# Patient Record
Sex: Female | Born: 1960 | Race: White | Hispanic: No | Marital: Married | Smoking: Never smoker
Health system: Southern US, Community
[De-identification: ages and names within clinical notes are randomized; demographics above are authoritative.]

## PROBLEM LIST (undated history)

## (undated) DIAGNOSIS — F419 Anxiety disorder, unspecified: Secondary | ICD-10-CM

## (undated) DIAGNOSIS — E7800 Pure hypercholesterolemia, unspecified: Secondary | ICD-10-CM

## (undated) DIAGNOSIS — F329 Major depressive disorder, single episode, unspecified: Secondary | ICD-10-CM

## (undated) DIAGNOSIS — K219 Gastro-esophageal reflux disease without esophagitis: Secondary | ICD-10-CM

## (undated) DIAGNOSIS — F32A Depression, unspecified: Secondary | ICD-10-CM

## (undated) DIAGNOSIS — R52 Pain, unspecified: Secondary | ICD-10-CM

## (undated) HISTORY — PX: BLADDER SURGERY: SHX569

---

## 1999-12-08 ENCOUNTER — Encounter: Admission: RE | Admit: 1999-12-08 | Discharge: 1999-12-08 | Payer: Self-pay | Admitting: Obstetrics and Gynecology

## 1999-12-08 ENCOUNTER — Encounter: Payer: Self-pay | Admitting: Obstetrics and Gynecology

## 2001-07-19 ENCOUNTER — Other Ambulatory Visit: Admission: RE | Admit: 2001-07-19 | Discharge: 2001-07-19 | Payer: Self-pay | Admitting: Obstetrics and Gynecology

## 2002-06-13 ENCOUNTER — Encounter: Payer: Self-pay | Admitting: Obstetrics and Gynecology

## 2002-06-13 ENCOUNTER — Encounter: Admission: RE | Admit: 2002-06-13 | Discharge: 2002-06-13 | Payer: Self-pay | Admitting: Obstetrics and Gynecology

## 2002-06-30 ENCOUNTER — Encounter: Payer: Self-pay | Admitting: Obstetrics and Gynecology

## 2002-06-30 ENCOUNTER — Ambulatory Visit (HOSPITAL_COMMUNITY): Admission: RE | Admit: 2002-06-30 | Discharge: 2002-06-30 | Payer: Self-pay | Admitting: Obstetrics and Gynecology

## 2005-12-26 ENCOUNTER — Encounter: Admission: RE | Admit: 2005-12-26 | Discharge: 2005-12-26 | Payer: Self-pay | Admitting: Obstetrics and Gynecology

## 2006-05-08 HISTORY — PX: FEMORAL HERNIA REPAIR: SUR1179

## 2006-09-26 ENCOUNTER — Emergency Department (HOSPITAL_COMMUNITY): Admission: EM | Admit: 2006-09-26 | Discharge: 2006-09-26 | Payer: Self-pay | Admitting: Emergency Medicine

## 2007-04-10 ENCOUNTER — Ambulatory Visit (HOSPITAL_COMMUNITY): Admission: RE | Admit: 2007-04-10 | Discharge: 2007-04-10 | Payer: Self-pay | Admitting: Surgery

## 2010-05-08 HISTORY — PX: BUNIONETTE EXCISION: SUR500

## 2010-09-20 NOTE — Op Note (Signed)
Savannah Whitney, Savannah Whitney                ACCOUNT NO.:  [id number]   MEDICAL RECORD NO.:  [id number]          PATIENT TYPE:  AMB   LOCATION:  DAY                          FACILITY:  [facility]   PHYSICIAN:  Sandria Bales. Ezzard Standing, M.D.  DATE OF BIRTH:  Apr 29, 1961   DATE OF PROCEDURE:  DATE OF DISCHARGE:  04/10/2007                               OPERATIVE REPORT   PREOPERATIVE DIAGNOSIS:  Right femoral hernia.   PREOPERATIVE DIAGNOSIS:  Small right femoral hernia and small indirect  inguinal hernia.   PROCEDURE:  Open right right femoral hernia repair.   SURGEON:  Sandria Bales. Ezzard Standing, M.D.   FIRST ASSISTANT:  None.   ANESTHESIA:  General laryngeal mask airway, with 30 mL of 0.25%  Marcaine.   COMPLICATIONS:  None at the end of the procedure.   INDICATIONS FOR PROCEDURE:  The patient is a 50 year old white female, a  patient of Dr. Merri Brunette, who I originally saw in July 2008 for a  right groin mass.  I felt the right groin mass probably represented a  right femoral hernia and discussed with her about repairing this hernia.   The indications and potential complications of hernia repair were  explained to the patient.  These potential complications include but are  not limited to infection, bleeding, nerve injury, vascular injury, and  possibility of recurrence of the hernia.  Prior to the surgery, I marked  the patient, both the site of the hernia repair and I marked the  ballottable mass I could feel along the medial thigh.   OPERATIVE NOTE:  The patient was placed in the supine position her arms  and legs slightly apart her right groin shaved prepped with Betadine  solution and sterilely draped.   She is allergic to latex so we used latex-free supplies.  We had a time-  out identifying the patient and the procedure.  She was given 1 gram of  Ancef and initially at the procedure.   Her right groin incision was then externally dissection carried down to  the external oblique fascia, where  it was opened and then explored below  the inguinal ligament on the right side.  The area I had marked actually  seemed a little more medial than I would have expected for a femoral  hernia.  I identified the femoral vein.  I went medial to this.  I  opened up the sheath of the femoral vein and went medial to this and saw  no obvious mass or filling defect.  I then dissected along after I  opened up the external oblique fascia I circled the round ligaments I  elevated the round ligaments, I divided with #0 chromic suture on an SH  needle.  I ligated them at the take-off of the pubic tubercle and then I  ligated a second time at the internal ring.   She had no obvious direct hernia.  She had a small indirect hernia .  She did have a small defect  at the femoral canal though both these  defects were small.  I got my finger under where  I had marked the skin  and could find no correlating mass or lump in that area.   So, I think I identified the area of the weakness though what I found  was less than impressive than what I expected to find.   I then carried out inguinal floor repair using a piece of 3 x 6 atrium  mesh which was cut down to about 2.5 x 4 inches just medial to the pubic  tubercle and inferior to the Cooper's ligament inferior and laterally to  the transversalis fascia superiorly in line inferolaterally to the  ilioinguinal ligaments superiorly to the transversalis fascia.  The mesh  lay flat.  Again, coverage of what I thought was femoral hernia and  direct hernia well.   The wound was then irrigated.  I put about 30 mL of 0.25% Marcaine as a  local anesthetic in multiple layers in the fascial subcutaneous tissues.  I had sewn the mesh in with a #0 Novofil suture.  I closed the skin with  a subcutaneous tissue.  I closed the external oblique fascia with a 3-0  Vicryl suture and the skin with a 3-0 Vicryl, the subcutaneous tissue  with 3-0 Vicryl suture and the skin with a  5-0 Monocryl suture.  The  wound was covered with tincture of benzoin and Steri-Strips.   The patient tolerated the procedure well, and was transported to the  recovery room in good condition.      Sandria Bales. Ezzard Standing, M.D.  Electronically Signed     DHN/MEDQ  D:  04/10/2007  T:  04/10/2007  Job:  [id number]   cc:   Dario Guardian, M.D.  Fax: [phone]   Duke Salvia. Marcelle Overlie, M.D.  Fax: [phone]

## 2011-02-13 LAB — HEMOGLOBIN AND HEMATOCRIT, BLOOD
HCT: 38.3
Hemoglobin: 13

## 2011-02-13 LAB — PREGNANCY, URINE: Preg Test, Ur: NEGATIVE

## 2011-11-28 ENCOUNTER — Other Ambulatory Visit (HOSPITAL_COMMUNITY): Payer: Self-pay | Admitting: Obstetrics and Gynecology

## 2011-11-28 DIAGNOSIS — Z1231 Encounter for screening mammogram for malignant neoplasm of breast: Secondary | ICD-10-CM

## 2011-12-20 ENCOUNTER — Ambulatory Visit (HOSPITAL_COMMUNITY)
Admission: RE | Admit: 2011-12-20 | Discharge: 2011-12-20 | Disposition: A | Discharge status: Home / Self Care | Payer: Self-pay | Source: Ambulatory Visit | Attending: Obstetrics and Gynecology | Admitting: Obstetrics and Gynecology

## 2011-12-20 DIAGNOSIS — Z1231 Encounter for screening mammogram for malignant neoplasm of breast: Secondary | ICD-10-CM

## 2012-10-31 ENCOUNTER — Other Ambulatory Visit: Payer: Self-pay | Admitting: Nurse Practitioner

## 2012-10-31 ENCOUNTER — Other Ambulatory Visit (HOSPITAL_COMMUNITY)
Admission: RE | Admit: 2012-10-31 | Discharge: 2012-10-31 | Disposition: A | Discharge status: Home / Self Care | Payer: BC Managed Care – PPO | Source: Ambulatory Visit | Attending: Obstetrics and Gynecology | Admitting: Obstetrics and Gynecology

## 2012-10-31 DIAGNOSIS — Z01419 Encounter for gynecological examination (general) (routine) without abnormal findings: Secondary | ICD-10-CM | POA: Insufficient documentation

## 2012-10-31 DIAGNOSIS — Z1151 Encounter for screening for human papillomavirus (HPV): Secondary | ICD-10-CM | POA: Insufficient documentation

## 2012-11-06 ENCOUNTER — Ambulatory Visit: Payer: BC Managed Care – PPO | Attending: Nurse Practitioner | Admitting: Physical Therapy

## 2012-11-06 DIAGNOSIS — IMO0001 Reserved for inherently not codable concepts without codable children: Secondary | ICD-10-CM | POA: Insufficient documentation

## 2012-11-06 DIAGNOSIS — M629 Disorder of muscle, unspecified: Secondary | ICD-10-CM | POA: Insufficient documentation

## 2012-11-06 DIAGNOSIS — R32 Unspecified urinary incontinence: Secondary | ICD-10-CM | POA: Insufficient documentation

## 2012-11-06 DIAGNOSIS — M2420 Disorder of ligament, unspecified site: Secondary | ICD-10-CM | POA: Insufficient documentation

## 2012-11-13 ENCOUNTER — Ambulatory Visit: Payer: BC Managed Care – PPO | Admitting: Physical Therapy

## 2012-11-20 ENCOUNTER — Ambulatory Visit: Payer: BC Managed Care – PPO | Admitting: Physical Therapy

## 2012-11-27 ENCOUNTER — Ambulatory Visit: Payer: BC Managed Care – PPO | Admitting: Physical Therapy

## 2012-12-04 ENCOUNTER — Ambulatory Visit: Payer: BC Managed Care – PPO | Admitting: Physical Therapy

## 2013-01-01 ENCOUNTER — Ambulatory Visit: Payer: BC Managed Care – PPO | Admitting: Physical Therapy

## 2013-01-22 ENCOUNTER — Other Ambulatory Visit: Payer: Self-pay | Admitting: Gastroenterology

## 2013-03-21 ENCOUNTER — Other Ambulatory Visit: Payer: Self-pay

## 2013-03-21 DIAGNOSIS — Z1231 Encounter for screening mammogram for malignant neoplasm of breast: Secondary | ICD-10-CM

## 2013-04-09 ENCOUNTER — Other Ambulatory Visit (HOSPITAL_COMMUNITY): Payer: Self-pay | Admitting: Family Medicine

## 2013-04-09 ENCOUNTER — Ambulatory Visit (HOSPITAL_COMMUNITY)
Admission: RE | Admit: 2013-04-09 | Discharge: 2013-04-09 | Disposition: A | Discharge status: Home / Self Care | Payer: BC Managed Care – PPO | Source: Ambulatory Visit | Attending: Nurse Practitioner | Admitting: Nurse Practitioner

## 2013-04-09 DIAGNOSIS — Z1231 Encounter for screening mammogram for malignant neoplasm of breast: Secondary | ICD-10-CM

## 2013-04-23 ENCOUNTER — Ambulatory Visit: Payer: BC Managed Care – PPO

## 2013-11-03 ENCOUNTER — Other Ambulatory Visit: Payer: Self-pay | Admitting: *Deleted

## 2013-11-03 DIAGNOSIS — I83893 Varicose veins of bilateral lower extremities with other complications: Secondary | ICD-10-CM

## 2013-12-26 ENCOUNTER — Encounter: Payer: Self-pay | Admitting: Surgery

## 2013-12-29 ENCOUNTER — Encounter: Payer: Self-pay | Admitting: Surgery

## 2013-12-29 ENCOUNTER — Ambulatory Visit (HOSPITAL_COMMUNITY)
Admission: RE | Admit: 2013-12-29 | Discharge: 2013-12-29 | Disposition: A | Discharge status: Home / Self Care | Payer: BC Managed Care – PPO | Source: Ambulatory Visit | Attending: Surgery | Admitting: Surgery

## 2013-12-29 ENCOUNTER — Ambulatory Visit (INDEPENDENT_AMBULATORY_CARE_PROVIDER_SITE_OTHER): Payer: BC Managed Care – PPO | Admitting: Surgery

## 2013-12-29 VITALS — BP 141/76 | HR 88 | Temp 98.2°F | Resp 18 | Ht 64.0 in | Wt 179.0 lb

## 2013-12-29 DIAGNOSIS — I83893 Varicose veins of bilateral lower extremities with other complications: Secondary | ICD-10-CM

## 2013-12-29 NOTE — Progress Notes (Signed)
Patient name: Savannah Whitney MRN: [id number] DOB: 03-Dec-1960 Sex: female   Referred by: Dr. Nicholos Johns  Reason for referral:  Chief Complaint  Patient presents with  . Varicose Veins    pain in both leg,  Previous right femoral hernia in right groin has "VV knot" in that spot.     HISTORY OF PRESENT ILLNESS: This is a 53 year old female who comes in today with complaints of leg pain.  She states that she has had leg pain and varicose veins since her pregnancy which was in the 1980s.  She states that prolonged standing makes her symptoms worse.  Elevation helps her symptoms The right leg is more symptomatic than the left.  She reports minimal to no swelling.  She has tried compression stockings without benefit.  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  The patient is medically managed for reflux disease as well as restless leg syndrome.  History reviewed. No pertinent past medical history.  Past Surgical History  Procedure Laterality Date  . Femoral hernia repair Right 2008  . Cesarean section  1982 and 1984     X 2     History   Social History  . Marital Status: Married    Spouse Name: N/A    Number of Children: N/A  . Years of Education: N/A   Occupational History  . Not on file.   Social History Main Topics  . Smoking status: Never Smoker   . Smokeless tobacco: Not on file  . Alcohol Use: No  . Drug Use: No  . Sexual Activity: Not on file   Other Topics Concern  . Not on file   Social History Narrative  . No narrative on file    Family History  Problem Relation Age of Onset  . Hyperlipidemia Mother   . COPD Mother   . Cancer Father     Allergies as of 12/29/2013 - Review Complete 12/29/2013  Allergen Reaction Noted  . Macrobid [nitrofurantoin monohyd macro] Other (See Comments) 12/29/2013  . Demerol [meperidine]  12/29/2013  . Latex  12/29/2013    No  current outpatient prescriptions on file prior to visit.   No current facility-administered medications on file prior to visit.     REVIEW OF SYSTEMS: Cardiovascular: No chest pain, chest pressure, palpitations, orthopnea, or dyspnea on exertion. No claudication or rest pain,  positive for varicose veins and leg pain Pulmonary: No productive cough, asthma or wheezing. Neurologic: No weakness, paresthesias, aphasia, or amaurosis. No dizziness. Hematologic: No bleeding problems or clotting disorders. Musculoskeletal: No joint pain or joint swelling. Gastrointestinal: No blood in stool or hematemesis Genitourinary: No dysuria or hematuria. Psychiatric:: No history of major depression. Integumentary: No rashes or ulcers. Constitutional: No fever or chills.  PHYSICAL EXAMINATION: General: The patient appears their stated age.  Vital signs are BP 141/76  Pulse 88  Temp(Src) 98.2 F (36.8 C) (Oral)  Resp 18  Ht  (1.626 m)  Wt 179 lb (81.194 kg)  BMI 30.71 kg/m2  SpO2 98% HEENT:  No gross abnormalities Pulmonary: Respirations are non-labored Abdomen: Soft and non-tender  Musculoskeletal: There are no major deformities.   Neurologic: No focal weakness or paresthesias are detected, Skin: There are no ulcer or rashes noted. Psychiatric: The patient has normal affect. Cardiovascular: There is a regular rate and rhythm without significant murmur appreciated.  No significant edema.  Palpable pedal  pulses.  Cluster of varicose veins around the right medial knee.  I could not appreciate any aneurysmal structure off of the femoral vein.  Diagnostic Studies: Venous insufficiency studies were reviewed.  There is no evidence of deep vein thrombosis bilaterally.  She has reflux within the right great saphenous vein with maximum diameter of 1.2 cm.  She has reflux in the left great saphenous vein with maximum diameter of 0.7.  Assessment:  Symptomatic venous insufficiency, right greater than  left Plan: The patient suffers from aching in her legs, right greater than left.  She has confirmed evidence of superficial system reflux bilaterally.  I'm going to place her in thigh-high 20-30 mm compression stockings to see if she gets any benefit from this.  She will followup in 3 months for discussions of laser ablation and possible stab phlebectomy of the right medial knee varicosity cluster     V. Charlena Cross, M.D. [facility] Office: [phone] Pager:  [phone]

## 2014-03-13 ENCOUNTER — Other Ambulatory Visit (HOSPITAL_COMMUNITY): Payer: Self-pay | Admitting: Family Medicine

## 2014-03-30 ENCOUNTER — Encounter: Payer: Self-pay | Admitting: Vascular Surgery

## 2014-03-31 ENCOUNTER — Encounter: Payer: Self-pay | Admitting: Vascular Surgery

## 2014-03-31 ENCOUNTER — Ambulatory Visit (INDEPENDENT_AMBULATORY_CARE_PROVIDER_SITE_OTHER): Payer: BC Managed Care – PPO | Admitting: Vascular Surgery

## 2014-03-31 VITALS — BP 148/79 | HR 78 | Ht 64.0 in | Wt 182.9 lb

## 2014-03-31 DIAGNOSIS — I83891 Varicose veins of right lower extremities with other complications: Secondary | ICD-10-CM

## 2014-03-31 NOTE — Progress Notes (Signed)
Subjective:     Patient ID: Savannah BellowsKathy D Draughon, female   DOB: 11/13/1960, 53 y.o.   MRN: [id number]  HPI this 53 year old female returns for continued follow-up regarding her painful varicosities and aching discomfort in the right leg greater than the left leg. She was seen by Dr. Myra GianottiBrabham 3 months ago who prescribed long-leg elastic compression stockings 20-30 millimeter gradient as well as elevation and ibuprofen. She has had severe cramping in the legs when she tried to use the stockings and they were not successful in alleviating her symptoms. She has no history of stasis ulcers or bleeding or DVT. She does state that the aching discomfort worsens as the day progresses and she develops edema in the right ankle area.  History reviewed. No pertinent past medical history.  History  Substance Use Topics  . Smoking status: Never Smoker   . Smokeless tobacco: Not on file  . Alcohol Use: No    Family History  Problem Relation Age of Onset  . Hyperlipidemia Mother   . COPD Mother   . Cancer Father     Allergies  Allergen Reactions  . Macrobid [employer][Nitrofurantoin Monohyd Macro] Other (See Comments)    Severe headache  . Demerol [Meperidine]     itching  . Latex     Skin rash with latex gloves   Ok with bandaids    Current outpatient prescriptions: aspirin 81 MG tablet, Take 81 mg by mouth daily., Disp: , Rfl: ;  CRANBERRY SOFT PO, Take by mouth 6 (six) times daily., Disp: , Rfl: ;  dexlansoprazole (DEXILANT) 60 MG capsule, Take 60 mg by mouth daily., Disp: , Rfl: ;  Multiple Minerals-Vitamins (CALCIUM-MAGNESIUM-ZINC-D3) TABS, Take by mouth 2 (two) times daily., Disp: , Rfl:  Multiple Vitamin (MULTIVITAMIN) capsule, Take 1 capsule by mouth daily., Disp: , Rfl: ;  polyethylene glycol (MIRALAX / GLYCOLAX) packet, Take 17 g by mouth daily., Disp: , Rfl: ;  pravastatin (PRAVACHOL) 40 MG tablet, Take 40 mg by mouth daily., Disp: , Rfl: ;  rOPINIRole (REQUIP) 1 MG tablet, Take 1 mg by mouth at bedtime.,  Disp: , Rfl: ;  Triamcinolone Acetonide (NASACORT AQ NA), Place into the nose as needed., Disp: , Rfl:  venlafaxine XR (EFFEXOR-XR) 75 MG 24 hr capsule, Take 75 mg by mouth daily with breakfast., Disp: , Rfl:   BP 148/79 mmHg  Pulse 78  Ht 5\' 4"  (1.626 m)  Wt 182 lb 14.4 oz (82.963 kg)  BMI 31.38 kg/m2  SpO2 100%  Body mass index is 31.38 kg/(m^2).           Review of Systems denies chest pain dyspnea on exertion PND or orthopnea. Does have leg pain with walking.     Objective:   Physical Exam BP 148/79 mmHg  Pulse 78  Ht 5\' 4"  (1.626 m)  Wt 182 lb 14.4 oz (82.963 kg)  BMI 31.38 kg/m2  SpO2 100%  Gen. well-developed well-nourished female in no apparent distress alert and oriented 3 Right leg with large nest of bulging varicosities in the distal thigh medially and in the medial calf area over the great saphenous system with 1+ chronic edema but no active ulceration. 3+ dorsalis pedis pulse palpable.  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 Will proceed with recertification to perform this in the near future to relieve her symptoms

## 2014-04-07 ENCOUNTER — Other Ambulatory Visit: Payer: Self-pay | Admitting: *Deleted

## 2014-04-07 DIAGNOSIS — I83891 Varicose veins of right lower extremities with other complications: Secondary | ICD-10-CM

## 2014-04-17 ENCOUNTER — Encounter: Payer: Self-pay | Admitting: Vascular Surgery

## 2014-04-20 ENCOUNTER — Encounter: Payer: Self-pay | Admitting: Vascular Surgery

## 2014-04-20 ENCOUNTER — Ambulatory Visit (INDEPENDENT_AMBULATORY_CARE_PROVIDER_SITE_OTHER): Payer: BC Managed Care – PPO | Admitting: Vascular Surgery

## 2014-04-20 VITALS — BP 119/85 | HR 95 | Resp 16 | Ht 64.0 in | Wt 180.0 lb

## 2014-04-20 DIAGNOSIS — I83891 Varicose veins of right lower extremities with other complications: Secondary | ICD-10-CM

## 2014-04-20 NOTE — Progress Notes (Signed)
Subjective:     Patient ID: Savannah Whitney, female   DOB: 11/20/1960, 53 y.o.   MRN: [id number]  HPI this 53 year old female had laser ablation of the right great saphenous vein from the distal thigh to near the saphenofemoral junction plus greater than 20 stab phlebectomy of painful varicosities formed under local tumescent anesthesia. A total of 1700 J of energy was utilized. She tolerated the procedure well. Review of Systems     Objective:   Physical Exam BP 119/85 mmHg  Pulse 95  Resp 16  Ht 5\' 4"  (1.626 m)  Wt 180 lb (81.647 kg)  BMI 30.88 kg/m2       Assessment:     Well-tolerated laser ablation right great saphenous vein plus greater than 20 stab phlebectomy of painful varicosities performed under local tumescent anesthesia    Plan:     Return in one week for venous duplex exam to confirm closure right great saphenous vein

## 2014-04-20 NOTE — Progress Notes (Signed)
   Laser Ablation Procedure      Date: 04/20/2014    Savannah Whitney DOB:07/14/1960  Consent signed: Yes  Surgeon:J.D. Hart RochesterLawson  Procedure: Laser Ablation: right Greater Saphenous Vein  BP 119/85 mmHg  Pulse 95  Resp 16  Ht 5\' 4"  (1.626 m)  Wt 180 lb (81.647 kg)  BMI 30.88 kg/m2  Start time: 3pm   End time: 4pm  Tumescent Anesthesia: 475 cc 0.9% NaCl with 50 cc Lidocaine HCL with 1% Epi and 15 cc 8.4% NaHCO3  Local Anesthesia: 7 cc Lidocaine HCL and NaHCO3 (ratio 2:1)  Pulsed mode: 15 watts, 500ms delay, 1.0 duration Total energy: 1782, total pulses: 120, total time: 1:59     Stab Phlebectomy: >20 Sites: Thigh and Calf  Patient tolerated procedure well: Yes  Notes:   Description of Procedure:  After marking the course of the secondary varicosities, the patient was placed on the operating table in the supine position, and the right leg was prepped and draped in sterile fashion.   Local anesthetic was administered and under ultrasound guidance the saphenous vein was accessed with a micro needle and guide wire; then the mirco puncture sheath was place.  A guide wire was inserted saphenofemoral junction , followed by a 5 french sheath.  The position of the sheath and then the laser fiber below the junction was confirmed using the ultrasound.  Tumescent anesthesia was administered along the course of the saphenous vein using ultrasound guidance. The patient was placed in Trendelenburg position and protective laser glasses were placed on patient and staff, and the laser was fired at 15 watt pulsed mode advancing 1-2 mm per sec for a total of 1782 joules.   For stab phlebectomies, local anesthetic was administered at the previously marked varicosities, and tumescent anesthesia was administered around the vessels.  Greater than 20 stab wounds were made using the tip of an 11 blade. And using the vein hook, the phlebectomies were performed using a hemostat to avulse the varicosities.   Adequate hemostasis was achieved.     Steri strips were applied to the stab wounds and ABD pads and thigh high compression stockings were applied.  Ace wrap bandages were applied over the phlebectomy sites and at the top of the saphenofemoral junction. Blood loss was less than 15 cc.  The patient ambulated out of the operating room having tolerated the procedure well.

## 2014-04-21 ENCOUNTER — Telehealth: Payer: Self-pay | Admitting: *Deleted

## 2014-04-21 NOTE — Telephone Encounter (Signed)
Pt doing ok. Having some discomfort. Following all instructions. No bleeding. Told her it was ok to loosen the ace wraps if she needs to. Reminded her of her fu appts.

## 2014-04-27 ENCOUNTER — Encounter: Payer: Self-pay | Admitting: Vascular Surgery

## 2014-04-28 ENCOUNTER — Encounter: Payer: Self-pay | Admitting: Vascular Surgery

## 2014-04-28 ENCOUNTER — Ambulatory Visit (HOSPITAL_COMMUNITY)
Admission: RE | Admit: 2014-04-28 | Discharge: 2014-04-28 | Disposition: A | Discharge status: Home / Self Care | Payer: BC Managed Care – PPO | Source: Ambulatory Visit | Attending: Vascular Surgery | Admitting: Vascular Surgery

## 2014-04-28 ENCOUNTER — Ambulatory Visit (INDEPENDENT_AMBULATORY_CARE_PROVIDER_SITE_OTHER): Payer: Self-pay | Admitting: Vascular Surgery

## 2014-04-28 VITALS — BP 142/97 | HR 76 | Resp 16 | Ht 64.0 in | Wt 180.0 lb

## 2014-04-28 DIAGNOSIS — I83891 Varicose veins of right lower extremities with other complications: Secondary | ICD-10-CM | POA: Diagnosis present

## 2014-04-28 NOTE — Progress Notes (Signed)
Subjective:     Patient ID: Savannah BellowsKathy D Edmundson, female   DOB: 10/17/1960, 53 y.o.   MRN: [id number]  HPI this 53 year old female returns 1 week post laser ablation right great saphenous vein with greater than 20 stab phlebectomy of painful varicosities performed under local tumescent anesthesia. She has had moderate discomfort in the right mid to proximal thigh where the ablation was performed. She has taken ibuprofen and 1 elastic stocking as instructed. Stab phlebectomy sites of cause no pain and she's had no distal edema.     Review of Systems denies chest pain, dyspnea on exertion, PND, orthopnea, hemoptysis     Objective:   Physical Exam BP 142/97 mmHg  Pulse 76  Resp 16  Ht 5\' 4"  (1.626 m)  Wt 180 lb (81.647 kg)  BMI 30.88 kg/m2  Gen. well-developed well-nourished female no apparent distress alert and oriented 3 Right leg with moderate ecchymosis in the mid to proximal thigh over the great saphenous vein. Stab phlebectomy sites healing satisfactorily. No distal edema. 3+ posterior tibial pulse palpable.  Today I ordered a venous duplex exam of the right leg which I reviewed and interpreted. There is no DVT. There is total closure of the right great saphenous vein up to near the saphenofemoral junction     Assessment:     Successful laser ablation right great saphenous vein with multiple stab phlebectomy of painful varicosities    Plan:     Return to see me on when necessary basis

## 2014-12-18 ENCOUNTER — Ambulatory Visit (INDEPENDENT_AMBULATORY_CARE_PROVIDER_SITE_OTHER): Payer: 59

## 2014-12-18 ENCOUNTER — Ambulatory Visit (INDEPENDENT_AMBULATORY_CARE_PROVIDER_SITE_OTHER): Payer: 59 | Admitting: Podiatry

## 2014-12-18 ENCOUNTER — Other Ambulatory Visit: Payer: Self-pay

## 2014-12-18 ENCOUNTER — Other Ambulatory Visit: Payer: Self-pay | Admitting: Podiatry

## 2014-12-18 VITALS — BP 133/81 | HR 95

## 2014-12-18 DIAGNOSIS — M722 Plantar fascial fibromatosis: Secondary | ICD-10-CM | POA: Diagnosis not present

## 2014-12-18 DIAGNOSIS — B351 Tinea unguium: Secondary | ICD-10-CM

## 2014-12-18 DIAGNOSIS — Z0189 Encounter for other specified special examinations: Secondary | ICD-10-CM

## 2014-12-18 DIAGNOSIS — Z1231 Encounter for screening mammogram for malignant neoplasm of breast: Secondary | ICD-10-CM

## 2014-12-18 DIAGNOSIS — M2010 Hallux valgus (acquired), unspecified foot: Secondary | ICD-10-CM

## 2014-12-18 LAB — HEPATIC FUNCTION PANEL
ALT: 21 U/L (ref 6–29)
AST: 23 U/L (ref 10–35)
Albumin: 4.2 g/dL (ref 3.6–5.1)
Alkaline Phosphatase: 79 U/L (ref 33–130)
Bilirubin, Direct: 0.1 mg/dL
Indirect Bilirubin: 0.5 mg/dL (ref 0.2–1.2)
Total Bilirubin: 0.6 mg/dL (ref 0.2–1.2)
Total Protein: 6.6 g/dL (ref 6.1–8.1)

## 2014-12-18 MED ORDER — DICLOFENAC SODIUM 75 MG PO TBEC: 75.0000 mg | DELAYED_RELEASE_TABLET | Freq: Two times a day (BID) | ORAL | Status: DC

## 2014-12-18 MED ORDER — TERBINAFINE HCL 250 MG PO TABS: 250.0000 mg | ORAL_TABLET | Freq: Every day | ORAL | Status: DC

## 2014-12-18 MED ORDER — TRIAMCINOLONE ACETONIDE 10 MG/ML IJ SUSP
10.0000 mg | Freq: Once | INTRAMUSCULAR | Status: AC
  Administered 2014-12-18: 10 mg

## 2014-12-18 NOTE — Patient Instructions (Signed)

## 2014-12-18 NOTE — Progress Notes (Signed)
   Subjective:    Patient ID: Savannah Whitney, female    DOB: 04/11/1961, 54 y.o.   MRN: [id number]  HPI Pt presents with right foot pain in heel area, radiating toward tendon, lasting approx 6 months and worse. Worse in the morning, but pain returns. Tried nsaids, ice and stretching   Review of Systems  All other systems reviewed and are negative.      Objective:   Physical Exam        Assessment & Plan:

## 2014-12-20 NOTE — Progress Notes (Addendum)
Subjective:     Patient ID: Savannah Whitney, female   DOB: 30-Aug-1960, 54 y.o.   MRN: [id number]  HPI patient presents stating I been having a lot of pain in my right heel for about the last 6 months and also I was concerned about my structural bunions and wanted to get them checked and I have on both feet. States the bunions are becoming increasingly painful and make it hard to wear shoe gear and that she states she's tried wider shoes she's tried padding in the past and she's tried anti-inflammatories. Also complains about the end of the fourth toe on her right foot stating that it's becoming increasingly tender and lesion has formed and an elevated fifth toe on the right foot   Review of Systems  All other systems reviewed and are negative.      Objective:   Physical Exam  Constitutional: She is oriented to person, place, and time.  Cardiovascular: Intact distal pulses.   Musculoskeletal: Normal range of motion.  Neurological: She is oriented to person, place, and time.  Skin: Skin is warm.  Nursing note and vitals reviewed.  neurovascular status found to be intact muscle strength adequate with range of motion of the subtalar midtarsal joint within normal limits. Patient's noted to have plantar aspect pain right heel at the insertional point of the tendon into the calcaneus with fluid buildup and moderate depression of the arch noted. Also has deformity around the first metatarsal heads of both feet with redness and  discomfort. I also noted that there is a keratotic lesion on the distal joint of the fourth digit right and there is also an elevation of the fifth digit on the right foot that is becoming painful     Assessment:     Acute plantar fasciitis right with structural imbalances along with structural bunion deformity    Plan:     H&P and x-rays reviewed of both feet and I then went ahead and injected the right plantar fascia 3 mg Kenalog 5 mg Xylocaine and applied fascial brace.  Gave instructions on physical therapy supportive shoes and discussed wider-type shoes for bunion deformity reappoint to recheck. If the bunions arch remains symptomatic as they have been  the need to consider surgical intervention and I also discussed correction of the fourth toe and also the elevated toe that is bothering her and making shoe gear difficult. X-ray evaluation of this patient indicated that there is elevation of the 1- 2 intermetatarsal angle of the right and the left foot of approximately 15 and there was an elevated fifth digit noted bilateral with enlargement of the interphalangeal joint of the fourth digit bilateral

## 2014-12-24 ENCOUNTER — Ambulatory Visit: Payer: Self-pay

## 2014-12-25 ENCOUNTER — Telehealth: Payer: Self-pay | Admitting: *Deleted

## 2014-12-25 ENCOUNTER — Ambulatory Visit: Payer: 59 | Admitting: Podiatry

## 2014-12-25 ENCOUNTER — Ambulatory Visit
Admission: RE | Admit: 2014-12-25 | Discharge: 2014-12-25 | Disposition: A | Discharge status: Home / Self Care | Payer: 59 | Source: Ambulatory Visit

## 2014-12-25 DIAGNOSIS — Z1231 Encounter for screening mammogram for malignant neoplasm of breast: Secondary | ICD-10-CM

## 2014-12-25 NOTE — Telephone Encounter (Signed)
Dr. Charlsie Merles reviewed 12/17/2104 hepatic function and states pt may take the medication as prescribed.  I informed pt.

## 2015-01-14 ENCOUNTER — Ambulatory Visit (INDEPENDENT_AMBULATORY_CARE_PROVIDER_SITE_OTHER): Payer: 59 | Admitting: Podiatry

## 2015-01-14 ENCOUNTER — Encounter: Payer: Self-pay | Admitting: Podiatry

## 2015-01-14 VITALS — BP 131/81 | HR 91 | Resp 16

## 2015-01-14 DIAGNOSIS — M2010 Hallux valgus (acquired), unspecified foot: Secondary | ICD-10-CM | POA: Diagnosis not present

## 2015-01-14 DIAGNOSIS — M722 Plantar fascial fibromatosis: Secondary | ICD-10-CM | POA: Diagnosis not present

## 2015-01-14 DIAGNOSIS — M2041 Other hammer toe(s) (acquired), right foot: Secondary | ICD-10-CM | POA: Diagnosis not present

## 2015-01-14 NOTE — Progress Notes (Addendum)
Subjective:     Patient ID: Savannah Whitney, female   DOB: 04/30/1961, 54 y.o.   MRN: [id number]  HPI patient states my heel is doing a lot better but I still think I'm walking on my toes and my arch is bothering me some. Patient states that the bunion is continuing to bother her and she's been modifying her shoe gear as she has been done over the last couple years and that's not helping and she's been trying soaks she's tried anti-inflammatories without relief and the bunion right over left is sore red and difficult to wear shoe gear with along with the fourth digit distal right and the elevation of the fifth toe which continues to give her problems   Review of Systems     Objective:   Physical Exam Neurovascular status intact muscle strength adequate with patient having continued discomfort of a moderate nature in the right plantar fascia more in the mid arch area with significant diminishment of discomfort in the heel and is able to bear weight on the heel but is not used to doing. Structural hyperostosis medial aspect first metatarsal head right and left with redness and pain upon palpation noted along with keratotic lesion fourth digit and elevation of the fifth digit    Assessment:     Plantar fasciitis improving right over left with inflammation and fluid noted and also noted to have mild arch pain which is probably compensatory in nature structural HAV deformity noted bilateral with failure to respond to conservative care and also hammertoe deformity fourth and fifth right over left foot    Plan:     Advised patient on condition and physical therapy for this to cure her problem. I want her to begin walking slowly increasing her weightbearing on the heel and she'll be seen back for is to recheck if symptoms indicate  Great of time going over with her shoe gear modifications and physical therapy. Reviewed with her at this time structural bunion correction which would require osteotomy with  pin and hammertoe repair of the fourth and fifth toes with lowering of the fifth toe due to the deformity at the MPJ. I spent a great of time educating her on this and she wants surgery but needs to wait until October and we will have her back for consult prior to procedure  X-ray evaluation of this patient was reviewed indicating elevation of the angle between the first and second metatarsal bilateral of approximate 15 and elevation of the fifth digit bilateral with enlargement of the middle phalanx and distal rotation of the fourth digit bilateral

## 2015-02-15 ENCOUNTER — Telehealth: Payer: Self-pay | Admitting: *Deleted

## 2015-02-15 NOTE — Telephone Encounter (Addendum)
"  I'm calling from [employer] on behalf of [facility]. Calling to request clinical information be faxed over to [place] to review for prior authorization request for surgery scheduled for 02/23/2015.  Fax to [phone]. "

## 2015-02-17 NOTE — Telephone Encounter (Signed)
"  Need clinicals as well as imaging report faxed to us for reference # [id number].  Fax number is [phone]."  Okay, we'll get that to you as soon as we can.

## 2015-02-18 ENCOUNTER — Ambulatory Visit (INDEPENDENT_AMBULATORY_CARE_PROVIDER_SITE_OTHER): Payer: 59 | Admitting: Podiatry

## 2015-02-18 ENCOUNTER — Encounter: Payer: Self-pay | Admitting: Podiatry

## 2015-02-18 DIAGNOSIS — M2041 Other hammer toe(s) (acquired), right foot: Secondary | ICD-10-CM

## 2015-02-18 DIAGNOSIS — M2010 Hallux valgus (acquired), unspecified foot: Secondary | ICD-10-CM

## 2015-02-18 NOTE — Patient Instructions (Signed)
Pre-Operative Instructions  Congratulations, you have decided to take an important step to improving your quality of life.  You can be assured that the doctors of [facility] will be with you every step of the way.  1. Plan to be at the surgery center/hospital at least 1 (one) hour prior to your scheduled time unless otherwise directed by the surgical center/hospital staff.  You must have a responsible adult accompany you, remain during the surgery and drive you home.  Make sure you have directions to the surgical center/hospital and know how to get there on time. 2. For hospital based surgery you will need to obtain a history and physical form from your family physician within 1 month prior to the date of surgery- we will give you a form for you primary physician.  3. We make every effort to accommodate the date you request for surgery.  There are however, times where surgery dates or times have to be moved.  We will contact you as soon as possible if a change in schedule is required.   4. No Aspirin/Ibuprofen for one week before surgery.  If you are on aspirin, any non-steroidal anti-inflammatory medications (Mobic, Aleve, Ibuprofen) you should stop taking it 7 days prior to your surgery.  You make take Tylenol  For pain prior to surgery.  5. Medications- If you are taking daily heart and blood pressure medications, seizure, reflux, allergy, asthma, anxiety, pain or diabetes medications, make sure the surgery center/hospital is aware before the day of surgery so they may notify you which medications to take or avoid the day of surgery. 6. No food or drink after midnight the night before surgery unless directed otherwise by surgical center/hospital staff. 7. No alcoholic beverages 24 hours prior to surgery.  No smoking 24 hours prior to or 24 hours after surgery. 8. Wear loose pants or shorts- loose enough to fit over bandages, boots, and casts. 9. No slip on shoes, sneakers are best. 10. Bring  your boot with you to the surgery center/hospital.  Also bring crutches or a walker if your physician has prescribed it for you.  If you do not have this equipment, it will be provided for you after surgery. 11. If you have not been contracted by the surgery center/hospital by the day before your surgery, call to confirm the date and time of your surgery. 12. Leave-time from work may vary depending on the type of surgery you have.  Appropriate arrangements should be made prior to surgery with your employer. 13. Prescriptions will be provided immediately following surgery by your doctor.  Have these filled as soon as possible after surgery and take the medication as directed. 14. Remove nail polish on the operative foot. 15. Wash the night before surgery.  The night before surgery wash the foot and leg well with the antibacterial soap provided and water paying special attention to beneath the toenails and in between the toes.  Rinse thoroughly with water and dry well with a towel.  Perform this wash unless told not to do so by your physician.  Enclosed: 1 Ice pack (please put in freezer the night before surgery)   1 Hibiclens skin cleaner   Pre-op Instructions  If you have any questions regarding the instructions, do not hesitate to call our office.  [place]: [address] [place], [place] [address] [phone]  [place]: [address] [place], [place] [address] [phone]  [place]: [address]  [place], [place] [address] [phone]  Dr. Richard   Tuchman DPM, Dr. Norman Regal DPM Dr. Richard Sikora DPM, Dr. M. Todd Hyatt DPM, Dr. Kathryn Egerton DPM 

## 2015-02-18 NOTE — Telephone Encounter (Signed)
Clinical notes were faxed to Theron AristaPeter at [facility] for review for surgical pre-certification.

## 2015-02-19 NOTE — Progress Notes (Signed)
Subjective:     Patient ID: Otho BellowsKathy D Savini, female   DOB: 10/24/1960, 54 y.o.   MRN: [id number]  HPI patient presents stating I'm ready to get my bunion and toes fixed on my right foot. 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 Today I allowed her to read a consent form for correction and explained alternative treatments and complications with patient and she is willing to accept risk and signs consent form for Austin-type osteotomy with pin distal arthroplasty digit for right and lowering up of a elevated fifth digit right foot. Patient understands that total recovery. Can take 6 months to one year and that there is no long-term guarantees of correction and she wants surgery. I also went ahead today and I dispensed air fracture walker with all instructions on usage and gave all instructions for the surgical intervention

## 2015-02-22 ENCOUNTER — Telehealth: Payer: Self-pay | Admitting: *Deleted

## 2015-02-22 ENCOUNTER — Other Ambulatory Visit: Payer: Self-pay | Admitting: Family Medicine

## 2015-02-22 ENCOUNTER — Other Ambulatory Visit (HOSPITAL_COMMUNITY)
Admission: RE | Admit: 2015-02-22 | Discharge: 2015-02-22 | Disposition: A | Discharge status: Home / Self Care | Payer: 59 | Source: Ambulatory Visit | Attending: Family Medicine | Admitting: Family Medicine

## 2015-02-22 DIAGNOSIS — Z124 Encounter for screening for malignant neoplasm of cervix: Secondary | ICD-10-CM | POA: Diagnosis not present

## 2015-02-22 NOTE — Telephone Encounter (Signed)
I faxed authorization for surgery scheduled for 02/23/2015 with Dr. Charlsie Merlesegal to Ar[facility], Hammer Toe Repair 4th distal and Angela NevinRuiz-Mora Cock In Toe 5th right foot.  Authorization number is  [id number].

## 2015-02-23 ENCOUNTER — Encounter: Payer: Self-pay | Admitting: Podiatry

## 2015-02-23 ENCOUNTER — Telehealth: Payer: Self-pay | Admitting: *Deleted

## 2015-02-23 DIAGNOSIS — M2011 Hallux valgus (acquired), right foot: Secondary | ICD-10-CM | POA: Diagnosis not present

## 2015-02-23 DIAGNOSIS — M2041 Other hammer toe(s) (acquired), right foot: Secondary | ICD-10-CM | POA: Diagnosis not present

## 2015-02-23 LAB — CYTOLOGY - PAP

## 2015-02-23 NOTE — Telephone Encounter (Signed)
entered in error   

## 2015-03-05 ENCOUNTER — Ambulatory Visit (INDEPENDENT_AMBULATORY_CARE_PROVIDER_SITE_OTHER): Payer: 59 | Admitting: Podiatry

## 2015-03-05 ENCOUNTER — Ambulatory Visit (INDEPENDENT_AMBULATORY_CARE_PROVIDER_SITE_OTHER): Payer: 59

## 2015-03-05 VITALS — BP 132/87 | HR 100 | Temp 98.2°F | Resp 16

## 2015-03-05 DIAGNOSIS — M722 Plantar fascial fibromatosis: Secondary | ICD-10-CM | POA: Diagnosis not present

## 2015-03-05 DIAGNOSIS — M2010 Hallux valgus (acquired), unspecified foot: Secondary | ICD-10-CM | POA: Diagnosis not present

## 2015-03-05 DIAGNOSIS — M2041 Other hammer toe(s) (acquired), right foot: Secondary | ICD-10-CM | POA: Diagnosis not present

## 2015-03-05 MED ORDER — OXYCODONE-ACETAMINOPHEN 10-325 MG PO TABS: 1.0000 | ORAL_TABLET | Freq: Four times a day (QID) | ORAL | Status: DC | PRN

## 2015-03-07 NOTE — Progress Notes (Signed)
Subjective:     Patient ID: Savannah BellowsKathy D Whitney, female   DOB: 12/08/1960, 54 y.o.   MRN: [id number]  HPI patient states my right foot is doing pretty well but my fifth toe has lifted up some in the air. I'm able to walk without discomfort or swelling   Review of Systems     Objective:   Physical Exam Neurovascular status intact muscle strength adequate range of motion within normal limits with patient noted to have a well-healed surgical site with wound edges that are well coapted first MPJ and excellent alignment and fourth and fifth toes healing well with mild elevation of the fifth toe still noted after attempting to lower it with a skinplasty procedure    Assessment:     Doing well with moderate elevation of the fifth toe right    Plan:     H&P and x-rays reviewed. Went ahead and truly plantarflexed the fifth toe and applied sterile dressing we will do this for the next several weeks and reevaluate and will be seen back 2 weeks for suture removal or earlier if any issues should occur

## 2015-03-08 ENCOUNTER — Telehealth: Payer: Self-pay | Admitting: *Deleted

## 2015-03-08 NOTE — Telephone Encounter (Addendum)
Pt asked if she could drive.  Pt states is having right top of her foot pain below her toes for last 3 days, what should she do?  Unable to leave message,"voicemail box is not set up yet." I spoke with pt, she states it's some better today, but feels like the shorter surgical boot is too loose and she has to hold the shoe on with her toes, and that might be part of the problem, and she wore the shorter surgical boot for 1.5 hours and her foot hurt so bad she had to go back in to the larger surgical boot.  I told pt to add a thicker sock to the surgery foot and see if that would help, if not call to make sure I was here and I would help her.  I told pt routinely I told pt to wear the shorter boot when at home, where she was more familiar with the set up and had areas to rest or change to the larger boot more readily available, and to wear the larger boot when out of the house for more protection.  Pt states understanding.

## 2015-03-22 ENCOUNTER — Encounter: Payer: Self-pay | Admitting: Podiatry

## 2015-03-22 ENCOUNTER — Ambulatory Visit (INDEPENDENT_AMBULATORY_CARE_PROVIDER_SITE_OTHER): Payer: 59

## 2015-03-22 ENCOUNTER — Ambulatory Visit (INDEPENDENT_AMBULATORY_CARE_PROVIDER_SITE_OTHER): Payer: 59 | Admitting: Podiatry

## 2015-03-22 DIAGNOSIS — M2010 Hallux valgus (acquired), unspecified foot: Secondary | ICD-10-CM

## 2015-03-22 DIAGNOSIS — Z9889 Other specified postprocedural states: Secondary | ICD-10-CM

## 2015-03-22 DIAGNOSIS — M2041 Other hammer toe(s) (acquired), right foot: Secondary | ICD-10-CM

## 2015-03-22 NOTE — Progress Notes (Signed)
Subjective:     Patient ID: Savannah Whitney, female   DOB: 04/07/1961, 54 y.o.   MRN: [id number]  HPI patient states that she's doing well with some swelling across the top and the fifth toe seems to be in better position   Review of Systems     Objective:   Physical Exam Neurovascular status intact negative Homan sign was noted with good alignment of the fourth and fifth toes and first MPJ with excellent range of motion of the first MPJ and wound edges that are coapted well.    Assessment:     Doing well post forefoot reconstruction right    Plan:     H&P x-rays reviewed and today I went ahead and applied a ankle brace with a digital splint of the fifth toe to lower the toe to keep it in good alignment. Patient will continue with occasional elevation gradual return soft shoe gear over the next couple weeks and will be rechecked in 4 weeks or earlier if necessary

## 2015-03-29 ENCOUNTER — Telehealth: Payer: Self-pay | Admitting: *Deleted

## 2015-03-29 NOTE — Telephone Encounter (Signed)
Fax refill request for Terbinafine.  Pt was prescribed 90 doses in 12/2014, refill denied.  Returned fax.

## 2015-04-19 ENCOUNTER — Ambulatory Visit (INDEPENDENT_AMBULATORY_CARE_PROVIDER_SITE_OTHER): Payer: 59

## 2015-04-19 ENCOUNTER — Ambulatory Visit (INDEPENDENT_AMBULATORY_CARE_PROVIDER_SITE_OTHER): Payer: 59 | Admitting: Podiatry

## 2015-04-19 ENCOUNTER — Encounter: Payer: Self-pay | Admitting: Podiatry

## 2015-04-19 VITALS — BP 136/100 | HR 109 | Resp 16

## 2015-04-19 DIAGNOSIS — M2041 Other hammer toe(s) (acquired), right foot: Secondary | ICD-10-CM

## 2015-04-19 DIAGNOSIS — Z9889 Other specified postprocedural states: Secondary | ICD-10-CM

## 2015-04-20 NOTE — Progress Notes (Signed)
Subjective:     Patient ID: Savannah Whitney, female   DOB: 05/08/1961, 54 y.o.   MRN: [id number]  HPI patient presents stating I'm doing well but I am concerned because my fifth toe has elevated and has not lowered as we had hoped. I still have swelling in my fourth toe and I feel like I'm walking on the side of it and the structural bunion is doing well   Review of Systems     Objective:   Physical Exam Neurovascular status intact negative Homans sign noted with first MPJ range of motion excellent with continued lifting of the fifth digit right despite skinplasty to lower the toe. There is still some edema in the fourth toe right and while the position is in a much better placed on previously I do think it is still irritated because of the pressure of the fifth toe and the swelling present at 8 weeks after surgery    Assessment:     Continued lifting of the fifth toe despite a V to Y skin plasty that was performed along with edema in the fourth toe and excellent structural correction of the bunion deformity    Plan:     Reviewed all conditions and I did find that the extensor tendon to the fifth digit appears to still be tight more proximal to the area that it was released. At this time I have recommended a more proximal-type tenotomy to see whether or not we can lower this toe and help to reduce the pressure against the fourth toe. I explained that there is no guarantee that this will do this and that ultimately could require a syndactylization procedure if it were to remain symptomatic. I also think that the fourth toe has continued healing to occur given the fact it's only 8 weeks after surgery and hopefully the irritation she experiences will resolve. She is scheduled for tenotomy in the office fifth digit right and I explained to her the procedure and reviewed risk and the fact there is no guarantee that this will make any difference in lifting of the fifth toe. She understands this and wants  to undergo procedure and is scheduled to do this in the office in the next several weeks

## 2015-06-02 ENCOUNTER — Telehealth: Payer: Self-pay | Admitting: *Deleted

## 2015-06-02 NOTE — Telephone Encounter (Signed)
I'm calling you regarding your surgery.  We have you scheduled for surgery on 06/16/2015.  I've been trying to check your insurance.  Did you have a change in insurance?  "I did."  Can you give me the name of insurance company, policy number and group number?  "No, I'm driving.  I'll call you back with it."

## 2015-06-11 NOTE — Telephone Encounter (Signed)
I'm calling you in regards to your surgery scheduled for Wednesday.  "I got to cancel that.  I appreciate you calling."

## 2015-06-16 ENCOUNTER — Ambulatory Visit: Payer: Self-pay | Admitting: Podiatry

## 2016-07-23 ENCOUNTER — Encounter (HOSPITAL_COMMUNITY): Payer: Self-pay | Admitting: Emergency Medicine

## 2016-07-23 ENCOUNTER — Ambulatory Visit (HOSPITAL_COMMUNITY)
Admission: EM | Admit: 2016-07-23 | Discharge: 2016-07-23 | Disposition: A | Discharge status: Home / Self Care | Payer: BLUE CROSS/BLUE SHIELD | Attending: Radiology | Admitting: Radiology

## 2016-07-23 DIAGNOSIS — R05 Cough: Secondary | ICD-10-CM

## 2016-07-23 DIAGNOSIS — R5383 Other fatigue: Secondary | ICD-10-CM

## 2016-07-23 DIAGNOSIS — R059 Cough, unspecified: Secondary | ICD-10-CM

## 2016-07-23 MED ORDER — DEXAMETHASONE SODIUM PHOSPHATE 10 MG/ML IJ SOLN
INTRAMUSCULAR | Status: AC
  Filled 2016-07-23: qty 1

## 2016-07-23 MED ORDER — DEXAMETHASONE SODIUM PHOSPHATE 10 MG/ML IJ SOLN
10.0000 mg | Freq: Once | INTRAMUSCULAR | Status: AC
  Administered 2016-07-23: 10 mg via INTRAMUSCULAR

## 2016-07-23 NOTE — ED Triage Notes (Signed)
[facility] with a complaint of a non-productive cough x 2 days.

## 2016-07-23 NOTE — ED Provider Notes (Signed)
CSN: [id number]     Arrival date & time 07/23/16  1204 History   None    Chief Complaint  Patient presents with  . Cough   (Consider location/radiation/quality/duration/timing/severity/associated sxs/prior Treatment) [age] y.o. female presents with residual cough and lethargy X 3 weeks. Patient states tht initially she had 'the flu" with vomiting and fever and the symptoms self resolved with the exception of the non productive cough and "feeling tired". Condition is acute in nature. Condition is made better by nothing. Condition is made worse by nothin. Patient denies any treatment prior to there arrival at this facility. Patient states that her mother is recently admitted in the hosptal for COPD and she would like to "fee; better fast" to see her/        History reviewed. No pertinent past medical history. Past Surgical History:  Procedure Laterality Date  . CESAREAN SECTION  1982 and 1984    X 2   . FEMORAL HERNIA REPAIR Right 2008   Family History  Problem Relation Age of Onset  . Hyperlipidemia Mother   . COPD Mother   . Cancer Father    Social History  Substance Use Topics  . Smoking status: Never Smoker  . Smokeless tobacco: Not on file  . Alcohol use No   OB History    No data available     Review of Systems  Constitutional: Positive for fatigue. Negative for chills and fever.  HENT: Negative for ear pain and sore throat.   Eyes: Negative for pain and visual disturbance.  Respiratory: Positive for cough ( non productive). Negative for shortness of breath.   Cardiovascular: Negative for chest pain and palpitations.  Gastrointestinal: Negative for abdominal pain and vomiting.  Genitourinary: Negative for dysuria and hematuria.  Musculoskeletal: Negative for arthralgias and back pain.  Skin: Negative for color change and rash.  Neurological: Negative for seizures and syncope.  All other systems reviewed and are negative.   Allergies  Macrobid [nitrofurantoin  monohyd macro]; Hydrocodone; Demerol [meperidine]; and Latex  Home Medications   Prior to Admission medications   Medication Sig Start Date End Date Taking? Authorizing Provider  aspirin 81 MG tablet Take 81 mg by mouth daily.    Historical Provider, MD  CRANBERRY SOFT PO Take by mouth 6 (six) times daily.    Historical Provider, MD  dexlansoprazole (DEXILANT) 60 MG capsule Take 60 mg by mouth daily.    Historical Provider, MD  diclofenac (VOLTAREN) 75 MG EC tablet Take 1 tablet (75 mg total) by mouth 2 (two) times daily. 12/18/14   Lenn SinkNorman S Regal, DPM  DULoxetine (CYMBALTA) 60 MG capsule Take 60 mg by mouth daily.    Historical Provider, MD  meloxicam (MOBIC) 15 MG tablet Take 15 mg by mouth daily.    Historical Provider, MD  Multiple Minerals-Vitamins (CALCIUM-MAGNESIUM-ZINC-D3) TABS Take by mouth 2 (two) times daily.    Historical Provider, MD  Multiple Vitamin (MULTIVITAMIN) capsule Take 1 capsule by mouth daily.    Historical Provider, MD  pantoprazole (PROTONIX) 40 MG tablet Take 40 mg by mouth daily.    Historical Provider, MD  polyethylene glycol (MIRALAX / GLYCOLAX) packet Take 17 g by mouth daily.    Historical Provider, MD  pravastatin (PRAVACHOL) 40 MG tablet Take 40 mg by mouth daily.    Historical Provider, MD  rOPINIRole (REQUIP) 1 MG tablet Take 1 mg by mouth at bedtime.    Historical Provider, MD  terbinafine (LAMISIL) 250 MG tablet Take 1 tablet (250  mg total) by mouth daily. 12/18/14   Lenn Sink, DPM  Triamcinolone Acetonide (NASACORT AQ NA) Place into the nose as needed.    Historical Provider, MD  venlafaxine XR (EFFEXOR-XR) 75 MG 24 hr capsule Take 75 mg by mouth daily with breakfast.    Historical Provider, MD   Meds Ordered and Administered this Visit   Medications  dexamethasone (DECADRON) injection 10 mg (10 mg Intramuscular Given 07/23/16 1248)    BP (!) 157/98 (BP Location: Right Arm)   Pulse 83   Temp 98.9 F (37.2 C) (Oral)   Resp 20   SpO2 100%  No  data found.   Physical Exam  Constitutional: She is oriented to person, place, and time. She appears well-developed and well-nourished.  HENT:  Head: Normocephalic and atraumatic.  Eyes: Conjunctivae are normal.  Neck: Normal range of motion.  Cardiovascular: Normal rate and regular rhythm.   Pulmonary/Chest: Effort normal and breath sounds normal.  Musculoskeletal: Normal range of motion.  Neurological: She is alert and oriented to person, place, and time.  Skin: Skin is warm.  Psychiatric: She has a normal mood and affect.  Nursing note and vitals reviewed.   Urgent Care Course     Procedures (including critical care time)  Labs Review Labs Reviewed - No data to display  Imaging Review No results found.       MDM   1. Cough        Alene Mires, NP 07/23/16 1255

## 2016-07-23 NOTE — Discharge Instructions (Signed)
Continue to push fluids and take over the counter medications as directed on the back of the box for symptomatic relief.  ° °

## 2017-08-22 ENCOUNTER — Institutional Professional Consult (permissible substitution): Payer: BLUE CROSS/BLUE SHIELD | Admitting: Pulmonary Disease

## 2017-12-12 ENCOUNTER — Encounter (HOSPITAL_COMMUNITY): Payer: Self-pay

## 2017-12-12 ENCOUNTER — Ambulatory Visit (HOSPITAL_COMMUNITY)
Admission: EM | Admit: 2017-12-12 | Discharge: 2017-12-12 | Disposition: A | Discharge status: Home / Self Care | Payer: BLUE CROSS/BLUE SHIELD | Attending: Family Medicine | Admitting: Family Medicine

## 2017-12-12 DIAGNOSIS — J0100 Acute maxillary sinusitis, unspecified: Secondary | ICD-10-CM | POA: Diagnosis not present

## 2017-12-12 HISTORY — DX: Pain, unspecified: R52

## 2017-12-12 HISTORY — DX: Pure hypercholesterolemia, unspecified: E78.00

## 2017-12-12 HISTORY — DX: Gastro-esophageal reflux disease without esophagitis: K21.9

## 2017-12-12 MED ORDER — BENZONATATE 100 MG PO CAPS: 100.0000 mg | ORAL_CAPSULE | Freq: Three times a day (TID) | ORAL | 0 refills | Status: DC

## 2017-12-12 MED ORDER — FLUTICASONE PROPIONATE 50 MCG/ACT NA SUSP: 2.0000 | Freq: Every day | NASAL | 0 refills | Status: DC

## 2017-12-12 MED ORDER — DOXYCYCLINE HYCLATE 100 MG PO CAPS: 100.0000 mg | ORAL_CAPSULE | Freq: Two times a day (BID) | ORAL | 0 refills | Status: DC

## 2017-12-12 MED ORDER — IPRATROPIUM BROMIDE 0.06 % NA SOLN: 2.0000 | Freq: Four times a day (QID) | NASAL | 0 refills | Status: DC

## 2017-12-12 NOTE — Discharge Instructions (Addendum)
Start doxycycline as directed for sinus infection, this will also help with bacteria in the lungs. Tessalon for cough. Start flonase, atrovent nasal spray for nasal congestion/drainage. Continue allergy medicine. You can use over the counter nasal saline rinse such as neti pot for nasal congestion. Keep hydrated, your urine should be clear to pale yellow in color. Tylenol/motrin for fever and pain. Monitor for any worsening of symptoms, chest pain, shortness of breath, wheezing, swelling of the throat, follow up for reevaluation.   For sore throat/cough try using a honey-based tea. Use 3 teaspoons of honey with juice squeezed from half lemon. Place shaved pieces of ginger into 1/2-1 cup of water and warm over stove top. Then mix the ingredients and repeat every 4 hours as needed.

## 2017-12-12 NOTE — ED Notes (Signed)
Bed: UC01 Expected date:  Expected time:  Means of arrival:  Comments: appt 

## 2017-12-12 NOTE — ED Triage Notes (Signed)
Pt presents with complaints of cough x 1 month that is productive.

## 2017-12-12 NOTE — ED Provider Notes (Signed)
[facility]    CSN: [id number] Arrival date & time: 12/12/17  1031     History   Chief Complaint Chief Complaint  Patient presents with  . Appointment    1038  . Cough    HPI Savannah Whitney is a 57 y.o. female.   57 year old female comes in for 1 month history of productive cough.  States prior to productive cough, started with rhinorrhea, nasal congestion, postnasal drip.  She denies fever, chills, night sweats.  Denies chest pain, shortness of breath, wheezing, palpitation.  Does have a history of GERD, states she is taking PPI with good relief and is well controlled.  Denies abdominal pain, nausea, vomiting.  Denies burning sensation of the chest.  She has been taking antihistamine, Mucinex with some relief.     Past Medical History:  Diagnosis Date  . GERD (gastroesophageal reflux disease)   . High cholesterol   . Pain     There are no active problems to display for this patient.   Past Surgical History:  Procedure Laterality Date  . CESAREAN SECTION  1982 and 1984    X 2   . FEMORAL HERNIA REPAIR Right 2008    OB History   None      Home Medications    Prior to Admission medications   Medication Sig Start Date End Date Taking? Authorizing Provider  meloxicam (MOBIC) 15 MG tablet Take 15 mg by mouth daily.   Yes [provider]  Multiple Minerals-Vitamins (CALCIUM-MAGNESIUM-ZINC-D3) TABS Take by mouth 2 (two) times daily.   Yes [provider]  omeprazole (PRILOSEC) 40 MG capsule Take 40 mg by mouth daily.   Yes [provider]  polyethylene glycol (MIRALAX / GLYCOLAX) packet Take 17 g by mouth daily.   Yes [provider]  pravastatin (PRAVACHOL) 40 MG tablet Take 40 mg by mouth daily.   Yes [provider]  rOPINIRole (REQUIP) 1 MG tablet Take 1 mg by mouth at bedtime.   Yes [provider]  traMADol (ULTRAM) 50 MG tablet Take by mouth every 6 (six) hours as needed.   Yes [provider]  benzonatate (TESSALON) 100 MG capsule Take 1 capsule (100 mg total) by mouth every 8 (eight) hours. 12/12/17   Cathie Hoops, Amy V, PA-C  doxycycline (VIBRAMYCIN) 100 MG capsule Take 1 capsule (100 mg total) by mouth 2 (two) times daily. 12/12/17   Cathie Hoops, Amy V, PA-C  fluticasone (FLONASE) 50 MCG/ACT nasal spray Place 2 sprays into both nostrils daily. 12/12/17   Cathie Hoops, Amy V, PA-C  ipratropium (ATROVENT) 0.06 % nasal spray Place 2 sprays into both nostrils 4 (four) times daily. 12/12/17   Belinda Fisher, PA-C    Family History Family History  Problem Relation Age of Onset  . Hyperlipidemia Mother   . COPD Mother   . Cancer Father     Social History Social History   Tobacco Use  . Smoking status: Never Smoker  . Smokeless tobacco: Never Used  Substance Use Topics  . Alcohol use: No  . Drug use: No     Allergies   Macrobid [nitrofurantoin monohyd macro]; Hydrocodone; Demerol [meperidine]; and Latex   Review of Systems Review of Systems  Reason unable to perform ROS: See HPI as above.     Physical Exam Triage Vital Signs ED Triage Vitals  Enc Vitals Group     BP 12/12/17 1057 138/82     Pulse Rate 12/12/17 1057 74  Resp 12/12/17 1057 18     Temp 12/12/17 1057 98.5 F (36.9 C)     Temp src --      SpO2 12/12/17 1057 99 %     Weight --      Height --      Head Circumference --      Peak Flow --      Pain Score 12/12/17 1058 0     Pain Loc --      Pain Edu? --      Excl. in GC? --    No data found.  Updated Vital Signs BP 138/82   Pulse 74   Temp 98.5 F (36.9 C)   Resp 18   SpO2 99%   Physical Exam  Constitutional: She is oriented to person, place, and time. She appears well-developed and well-nourished. No distress.  HENT:  Head: Normocephalic and atraumatic.  Right Ear: Tympanic membrane, external ear and ear canal normal. Tympanic membrane is not erythematous and not bulging.  Left Ear: Tympanic membrane, external ear and ear canal normal. Tympanic  membrane is not erythematous and not bulging.  Nose: Right sinus exhibits maxillary sinus tenderness. Right sinus exhibits no frontal sinus tenderness. Left sinus exhibits maxillary sinus tenderness. Left sinus exhibits no frontal sinus tenderness.  Mouth/Throat: Uvula is midline, oropharynx is clear and moist and mucous membranes are normal.  Eyes: Pupils are equal, round, and reactive to light. Conjunctivae are normal.  Neck: Normal range of motion. Neck supple.  Cardiovascular: Normal rate, regular rhythm and normal heart sounds. Exam reveals no gallop and no friction rub.  No murmur heard. Pulmonary/Chest: Effort normal and breath sounds normal. She has no decreased breath sounds. She has no wheezes. She has no rhonchi. She has no rales.  Lymphadenopathy:    She has no cervical adenopathy.  Neurological: She is alert and oriented to person, place, and time.  Skin: Skin is warm and dry.  Psychiatric: She has a normal mood and affect. Her behavior is normal. Judgment normal.    UC Treatments / Results  Labs (all labs ordered are listed, but only abnormal results are displayed) Labs Reviewed - No data to display  EKG None  Radiology No results found.  Procedures Procedures (including critical care time)  Medications Ordered in UC Medications - No data to display  Initial Impression / Assessment and Plan / UC Course  I have reviewed the triage vital signs and the nursing notes.  Pertinent labs & imaging results that were available during my care of the patient were reviewed by me and considered in my medical decision making (see chart for details).    Will treat for sinusitis with doxycycline. Discussed with patient, this will also cover for bronchitis/pneumonia, and will defer CXR for now as it will not change treatment. Patient is afebrile and lungs clear to auscultation bilaterally without adventitious lung sounds. Patient expresses understanding.  Other symptomatic treatment  discussed.  Return precautions given.  Patient expresses understanding and agrees to plan.  Final Clinical Impressions(s) / UC Diagnoses   Final diagnoses:  Acute non-recurrent maxillary sinusitis    ED Prescriptions    Medication Sig Dispense Auth. Provider   doxycycline (VIBRAMYCIN) 100 MG capsule Take 1 capsule (100 mg total) by mouth 2 (two) times daily. 20 capsule Yu, Amy V, PA-C   fluticasone (FLONASE) 50 MCG/ACT nasal spray Place 2 sprays into both nostrils daily. 1 g Yu, Amy V, PA-C   ipratropium (ATROVENT) 0.06 % nasal spray  Place 2 sprays into both nostrils 4 (four) times daily. 15 mL Yu, Amy V, PA-C   benzonatate (TESSALON) 100 MG capsule Take 1 capsule (100 mg total) by mouth every 8 (eight) hours. 21 capsule Threasa AlphaYu, Amy V, PA-C        Yu, Amy V, [place]PA-[place] 12/12/17 1120

## 2018-01-01 ENCOUNTER — Other Ambulatory Visit: Payer: Self-pay | Admitting: Family Medicine

## 2018-01-01 ENCOUNTER — Ambulatory Visit
Admission: RE | Admit: 2018-01-01 | Discharge: 2018-01-01 | Disposition: A | Discharge status: Home / Self Care | Payer: BLUE CROSS/BLUE SHIELD | Source: Ambulatory Visit | Attending: Family Medicine | Admitting: Family Medicine

## 2018-01-01 DIAGNOSIS — R05 Cough: Secondary | ICD-10-CM

## 2018-01-01 DIAGNOSIS — R053 Chronic cough: Secondary | ICD-10-CM

## 2018-01-08 ENCOUNTER — Ambulatory Visit: Payer: BLUE CROSS/BLUE SHIELD | Admitting: Internal Medicine

## 2018-01-08 ENCOUNTER — Encounter: Payer: Self-pay | Admitting: Internal Medicine

## 2018-01-08 ENCOUNTER — Other Ambulatory Visit (INDEPENDENT_AMBULATORY_CARE_PROVIDER_SITE_OTHER): Payer: BLUE CROSS/BLUE SHIELD

## 2018-01-08 VITALS — BP 126/80 | HR 87 | Ht 62.5 in | Wt 174.0 lb

## 2018-01-08 DIAGNOSIS — H6122 Impacted cerumen, left ear: Secondary | ICD-10-CM | POA: Diagnosis not present

## 2018-01-08 DIAGNOSIS — R058 Other specified cough: Secondary | ICD-10-CM

## 2018-01-08 DIAGNOSIS — R05 Cough: Secondary | ICD-10-CM | POA: Diagnosis not present

## 2018-01-08 LAB — CBC WITH DIFFERENTIAL/PLATELET
BASOS PCT: 1.1 % (ref 0.0–3.0)
Basophils Absolute: 0.1 10*3/uL (ref 0.0–0.1)
EOS ABS: 0.1 10*3/uL (ref 0.0–0.7)
Eosinophils Relative: 2.1 % (ref 0.0–5.0)
HCT: 39.2 % (ref 36.0–46.0)
HEMOGLOBIN: 13 g/dL (ref 12.0–15.0)
LYMPHS ABS: 2 10*3/uL (ref 0.7–4.0)
Lymphocytes Relative: 34.5 % (ref 12.0–46.0)
MCHC: 33.1 g/dL (ref 30.0–36.0)
MCV: 87.4 fl (ref 78.0–100.0)
MONO ABS: 0.6 10*3/uL (ref 0.1–1.0)
Monocytes Relative: 10.4 % (ref 3.0–12.0)
NEUTROS ABS: 3.1 10*3/uL (ref 1.4–7.7)
NEUTROS PCT: 51.9 % (ref 43.0–77.0)
PLATELETS: 282 10*3/uL (ref 150.0–400.0)
RBC: 4.48 Mil/uL (ref 3.87–5.11)
RDW: 12.8 % (ref 11.5–15.5)
WBC: 5.9 10*3/uL (ref 4.0–10.5)

## 2018-01-08 MED ORDER — RANITIDINE HCL 300 MG PO TABS: 300.0000 mg | ORAL_TABLET | Freq: Every day | ORAL | Status: DC

## 2018-01-08 MED ORDER — GABAPENTIN 100 MG PO CAPS: 100.0000 mg | ORAL_CAPSULE | Freq: Four times a day (QID) | ORAL | 2 refills | Status: DC

## 2018-01-08 NOTE — Progress Notes (Signed)
Savannah Whitney, female    DOB: 09/16/1960,     MRN: [id number]   Brief patient profile:  74 yowf  Never smoker / never any resp problems despite problem with spring time episodes of  eyes watering x around 2014 responded to allegra then onset then summer 2018 intermittent cough not assoc with the watery eyes which variably worsened since then to daily pattern assoc with overt sensation of gerd to point  Of vomiting (gerd actually dates back to 2009 but much worse once started coughing) so referred to pulmonary clinic 01/08/2018 by Dr   Azucena Cecil s/p Marcellino(ENT)  w/u c/w gerd 08/21/17     01/08/2018  Pulmonary Consultation  Chief Complaint  Patient presents with  . Pulmonary Consult    Referred by Dr. Tally Joe. Pt c/o cough off and on for the past year, worse for the past 4 months. Cough is prod with yellow to brown sputum, and seems worse in the am and early evening.   Dyspnea:  Not limited by breathing from desired activities   Cough: esp x 4 months esp early am/ p supper steaky brb/ no epistaxis but sensation of pnds in certain positions Sleep: 2 pillows plus hob is 4 in up  SABA use: never     Kouffman Reflux v Neurogenic Cough Differentiator Reflux Comments  Do you awaken from a sound sleep coughing violently?                            With trouble breathing? rarely   Do you have choking episodes when you cannot  Get enough air, gasping for air ?              Yes, freq   Do you usually cough when you lie down into  The bed, or when you just lie down to rest ?                          no   Do you usually cough after meals or eating?         no   Do you cough when (or after) you bend over?    Yes   GERD SCORE     Kouffman Reflux v Neurogenic Cough Differentiator Neurogenic   Do you more-or-less cough all day long?  sporadic    Does change of temperature make you cough? yes   Does laughing or chuckling cause you to cough? No    Do fumes (perfume, automobile fumes, burned  Toast,  etc.,) cause you to cough ?      no   Does speaking, singing, or talking on the phone cause you to cough   ?               Yes    Neurogenic/Airway score       No obvious day to day or daytime variability or assoc   mucus plugs  or cp or chest tightness, subjective wheeze or overt sinus or hb symptoms.   Sleeping as above with rare nocturnal    exacerbation  of respiratory  c/o's or need for noct saba. Also denies any obvious fluctuation of symptoms with weather or environmental changes or other aggravating or alleviating factors except as outlined above   No unusual exposure hx or h/o childhood pna/ asthma or knowledge of premature birth.  Current Allergies, Complete Past Medical History, Past Surgical History, Family  History, and Social History were reviewed in [employer] record.  ROS  The following are not active complaints unless bolded Hoarseness, sore throat, dysphagia/sense of globus assoc with pnds, dental problems, itching, sneezing,  nasal congestion or discharge of excess mucus or purulent secretions, ear ache,   fever, chills, sweats, unintended wt loss or wt gain, classically pleuritic or exertional cp,  orthopnea pnd or arm/hand swelling  or leg swelling, presyncope, palpitations, abdominal pain, anorexia, nausea, vomiting, diarrhea  or change in bowel habits or change in bladder habits, change in stools or change in urine, dysuria, hematuria,  rash, arthralgias, visual complaints, headache, numbness, weakness or ataxia or problems with walking or coordination,  change in mood or  memory.               Past Medical History:  Diagnosis Date  . GERD (gastroesophageal reflux disease)   . High cholesterol   . Pain     Outpatient Medications Prior to Visit  Medication Sig Dispense Refill  . gabapentin (NEURONTIN) 100 MG capsule Take 1 capsule by mouth daily.  2  . meloxicam (MOBIC) 15 MG tablet Take 15 mg by mouth daily.    . Multiple  Minerals-Vitamins (CALCIUM-MAGNESIUM-ZINC-D3) TABS Take by mouth 2 (two) times daily.    Marland Kitchen MYRBETRIQ 50 MG TB24 tablet Take 50 mg by mouth daily.  3  . omeprazole (PRILOSEC) 40 MG capsule Take 40 mg by mouth 2 (two) times daily.     . polyethylene glycol (MIRALAX / GLYCOLAX) packet Take 17 g by mouth daily.    . pravastatin (PRAVACHOL) 40 MG tablet Take 40 mg by mouth every 7 (seven) days.     . ranitidine (ZANTAC) 300 MG tablet Take 300 mg by mouth 2 (two) times daily.  3  . rOPINIRole (REQUIP) 1 MG tablet Take 1 mg by mouth at bedtime.    . traMADol (ULTRAM) 50 MG tablet Take by mouth every 6 (six) hours as needed.    . benzonatate (TESSALON) 100 MG capsule Take 1 capsule (100 mg total) by mouth every 8 (eight) hours. 21 capsule 0  . doxycycline (VIBRAMYCIN) 100 MG capsule Take 1 capsule (100 mg total) by mouth 2 (two) times daily. 20 capsule 0  . fluticasone (FLONASE) 50 MCG/ACT nasal spray Place 2 sprays into both nostrils daily. 1 g 0  . ipratropium (ATROVENT) 0.06 % nasal spray Place 2 sprays into both nostrils 4 (four) times daily. 15 mL 0   No facility-administered medications prior to visit.             Objective:     BP 126/80 (BP Location: Left Arm, Cuff Size: Normal)   Pulse 87   Ht 5' 2.5" (1.588 m)   Wt 174 lb (78.9 kg)   SpO2 98%   BMI 31.32 kg/m   SpO2: 98 % RA   HEENT: nl dentition, turbinates bilaterally, and oropharynx. Nl external ear canals with impaction L ear with wax and mild cough reflex   NECK :  without JVD/Nodes/TM/ nl carotid upstrokes bilaterally   LUNGS: no acc muscle use,  Nl contour chest which is clear to A and P bilaterally without cough on insp or exp maneuvers   CV:  RRR  no s3 or murmur or increase in P2, and no edema   ABD:  soft and nontender with nl inspiratory excursion in the supine position. No bruits or organomegaly appreciated, bowel sounds nl  MS:  Nl gait/ ext warm without  deformities, calf tenderness, cyanosis or  clubbing No obvious joint restrictions   SKIN: warm and dry without lesions    NEURO:  alert, approp, nl sensorium with  no motor or cerebellar deficits apparent.      Labs ordered 01/08/2018  Allergy profile    I personally reviewed images and agree with radiology impression as follows:  CXR:   01/01/18  No active cardiopulmonary disease.      Assessment   Upper airway cough syndrome Onset summer 2018  - Allergy profile 01/08/2018 >  Eos 0.1 /  IgE pending    - cyclical cough rx 01/08/2018    The most common causes of chronic cough in immunocompetent adults include the following: upper airway cough syndrome (UACS), previously referred to as postnasal drip syndrome (PNDS), which is caused by variety of rhinosinus conditions; (2) asthma; (3) GERD; (4) chronic bronchitis from cigarette smoking or other inhaled environmental irritants; (5) nonasthmatic eosinophilic bronchitis; and (6) bronchiectasis.   These conditions, singly or in combination, have accounted for up to 94% of the causes of chronic cough in prospective studies.   Other conditions have constituted no >6% of the causes in prospective studies These have included bronchogenic carcinoma, chronic interstitial pneumonia, sarcoidosis, left ventricular failure, ACEI-induced cough, and aspiration from a condition associated with pharyngeal dysfunction.    Chronic cough is often simultaneously caused by more than one condition. A single cause has been found from 38 to 82% of the time, multiple causes from 18 to 62%. Multiply caused cough has been the result of three diseases up to 42% of the time.       Most likely this is Upper airway cough syndrome (previously labeled PNDS),  is so named because it's frequently impossible to sort out how much is  CR/sinusitis with freq throat clearing (which can be related to primary GERD)   vs  causing  secondary (" extra esophageal")  GERD from wide swings in gastric pressure that occur with  throat clearing, often  promoting self use of mint and menthol lozenges that reduce the lower esophageal sphincter tone and exacerbate the problem further in a cyclical fashion.   These are the same pts (now being labeled as having "irritable larynx syndrome" by some cough centers) who not infrequently have a history of having failed to tolerate ace inhibitors,  dry powder inhalers or biphosphonates or report having atypical/extraesophageal reflux symptoms that don't respond to standard doses of PPI  and are easily confused as having aecopd or asthma flares by even experienced allergists/ pulmonologists (myself included).    Of the three most common causes of  Sub-acute / recurrent or chronic cough, only one (GERD)  can actually contribute to/ trigger  the other two (asthma and post nasal drip syndrome)  and perpetuate the cylce of cough.  While not intuitively obvious, many patients with chronic low grade reflux do not cough until there is a primary insult that disturbs the protective epithelial barrier and exposes sensitive nerve endings.   This is typically viral but can due to PNDS and  either may apply here.   The point is that once this occurs, it is difficult to eliminate the cycle  using anything but a maximally effective acid suppression regimen at least in the short run, accompanied by an appropriate diet to address non acid GERD and control / eliminate the cough itself for at least 3 days with codeine and push gabapentin to 100 mg qid if tolerates   Since having  purulent sputum with streaks of hemotysis will also cover for bronchitis/ sinusitis with augmentin x 10 days and f/u in 2 weeks with all meds in hand using a trust but verify approach to confirm accurate Medication  Reconciliation The principal here is that until we are certain that the  patients are doing what we've asked, it makes no sense to ask them to do more.      Reviewed:  The standardized cough guidelines published in  Chest by [place] in 2006 are still the best available and consist of a multiple step process (up to 12!) , not a single office visit,  and are intended  to address this problem logically,  with an alogrithm dependent on response to empiric treatment at  each progressive step  to determine a specific diagnosis with  minimal addtional testing needed. Therefore if adherence is an issue or can't be accurately verified,  it's very unlikely the standard evaluation and treatment will be successful here.    Furthermore, response to therapy (other than acute cough suppression, which should only be used short term with avoidance of narcotic containing cough syrups if possible), can be a gradual process for which the patient is not likely to  perceive immediate benefit.  Unlike going to an eye doctor where the best perscription is almost always the first one and is immediately effective, this is almost never the case in the management of chronic cough syndromes. Therefore the patient needs to commit up front to consistently adhere to recommendations  for up to 6 weeks of therapy directed at the likely underlying problem(s) before the response can be reasonably evaluated.        Excessive ear wax, left Assoc with mild cough reflex on exam 01/08/2018   rec otc ear wax removal then f/u in 2 weeks with NP to be sure cleared.    Total time devoted to counseling  > 50 % of initial 60 min office visit:  review case with pt/ discussion of options/alternatives/ personally creating written customized instructions  in presence of pt  then going over those specific  Instructions directly with the pt including how to use all of the meds but in particular covering each new medication in detail and the difference between the maintenance= "automatic" meds and the prns using an action plan format for the latter (If this problem/symptom => do that organization reading Left to right).  Please see AVS from this visit for a  full list of these instructions which I personally wrote for this pt and  are unique to this visit.      Sandrea Hughs, MD 01/08/2018

## 2018-01-08 NOTE — Patient Instructions (Addendum)
Ear wax remover kit is over counter   Augmentin 875 mg take one pill twice daily  X 10 days - take at breakfast and supper with large glass of water.  It would help reduce the usual side effects (diarrhea and yeast infections) if you ate cultured yogurt at lunch.    Omeprazole 40 mg Take 30- 60 min before your first and last meals of the day   Zantac 300 mg one hour before bedtime   Gabapentin 100 mg four times a day   mucinex dm 1200 mg every hours and supplement  With tramadol up to every 4 hours    Please remember to go to the lab department downstairs in the basement  for your tests - we will call you with the results when they are available.   GERD (REFLUX)  is an extremely common cause of respiratory symptoms just like yours , many times with no obvious heartburn at all.    It can be treated with medication, but also with lifestyle changes including elevation of the head of your bed (ideally with 6 inch  bed blocks),  Smoking cessation, avoidance of late meals, excessive alcohol, and avoid fatty foods, chocolate, peppermint, colas, red wine, and acidic juices such as orange juice.  NO MINT OR MENTHOL PRODUCTS SO NO COUGH DROPS  USE SUGARLESS CANDY INSTEAD (Jolley ranchers or Stover's or Life Savers) or even ice chips will also do - the key is to swallow to prevent all throat clearing. NO OIL BASED VITAMINS - use powdered substitutes.    See [name] or [name]   In 2 weeks to have your ear checked and set up your follow up with me but bring all your medications including over the counter meds with you.

## 2018-01-09 ENCOUNTER — Encounter: Payer: Self-pay | Admitting: Internal Medicine

## 2018-01-09 DIAGNOSIS — H6122 Impacted cerumen, left ear: Secondary | ICD-10-CM | POA: Insufficient documentation

## 2018-01-09 LAB — RESPIRATORY ALLERGY PROFILE REGION II ~~LOC~~
Allergen, Cedar tree, t12: <0.1 kU/L
Allergen, D pternoyssinus,d7: <0.1 kU/L
Allergen, Mulberry, t76: <0.1 kU/L
Allergen, P. notatum, m1: <0.1 kU/L
Aspergillus fumigatus, m3: <0.1 kU/L
Box Elder IgE: <0.1 kU/L
CLADOSPORIUM HERBARUM (M2) IGE: <0.1 kU/L
CLASS: 0
CLASS: 0
CLASS: 0
CLASS: 0
CLASS: 0
CLASS: 0
CLASS: 0
CLASS: 0
CLASS: 0
CLASS: 0
CLASS: 0
Cat Dander: <0.1 kU/L
Class: 0
Class: 0
Class: 0
Class: 0
Class: 0
Class: 0
Class: 0
Class: 0
Class: 0
Class: 0
Class: 0
Class: 0
Class: 0
D. farinae: <0.1 kU/L
Dog Dander: <0.1 kU/L
Elm IgE: <0.1 kU/L
IgE (Immunoglobulin E), Serum: 12 kU/L (ref <=114)
Johnson Grass: <0.1 kU/L
Rough Pigweed  IgE: <0.1 kU/L

## 2018-01-09 LAB — INTERPRETATION:

## 2018-01-09 MED ORDER — AMOXICILLIN-POT CLAVULANATE 875-125 MG PO TABS: 1.0000 | ORAL_TABLET | Freq: Two times a day (BID) | ORAL | 0 refills | Status: AC

## 2018-01-09 NOTE — Assessment & Plan Note (Addendum)
Onset summer 2018  - Allergy profile 01/08/2018 >  Eos 0.1 /  IgE pending    - cyclical cough rx 01/08/2018    The most common causes of chronic cough in immunocompetent adults include the following: upper airway cough syndrome (UACS), previously referred to as postnasal drip syndrome (PNDS), which is caused by variety of rhinosinus conditions; (2) asthma; (3) GERD; (4) chronic bronchitis from cigarette smoking or other inhaled environmental irritants; (5) nonasthmatic eosinophilic bronchitis; and (6) bronchiectasis.   These conditions, singly or in combination, have accounted for up to 94% of the causes of chronic cough in prospective studies.   Other conditions have constituted no >6% of the causes in prospective studies These have included bronchogenic carcinoma, chronic interstitial pneumonia, sarcoidosis, left ventricular failure, ACEI-induced cough, and aspiration from a condition associated with pharyngeal dysfunction.    Chronic cough is often simultaneously caused by more than one condition. A single cause has been found from 38 to 82% of the time, multiple causes from 18 to 62%. Multiply caused cough has been the result of three diseases up to 42% of the time.       Most likely this is Upper airway cough syndrome (previously labeled PNDS),  is so named because it's frequently impossible to sort out how much is  CR/sinusitis with freq throat clearing (which can be related to primary GERD)   vs  causing  secondary (" extra esophageal")  GERD from wide swings in gastric pressure that occur with throat clearing, often  promoting self use of mint and menthol lozenges that reduce the lower esophageal sphincter tone and exacerbate the problem further in a cyclical fashion.   These are the same pts (now being labeled as having "irritable larynx syndrome" by some cough centers) who not infrequently have a history of having failed to tolerate ace inhibitors,  dry powder inhalers or biphosphonates or  report having atypical/extraesophageal reflux symptoms that don't respond to standard doses of PPI  and are easily confused as having aecopd or asthma flares by even experienced allergists/ pulmonologists (myself included).    Of the three most common causes of  Sub-acute / recurrent or chronic cough, only one (GERD)  can actually contribute to/ trigger  the other two (asthma and post nasal drip syndrome)  and perpetuate the cylce of cough.  While not intuitively obvious, many patients with chronic low grade reflux do not cough until there is a primary insult that disturbs the protective epithelial barrier and exposes sensitive nerve endings.   This is typically viral but can due to PNDS and  either may apply here.   The point is that once this occurs, it is difficult to eliminate the cycle  using anything but a maximally effective acid suppression regimen at least in the short run, accompanied by an appropriate diet to address non acid GERD and control / eliminate the cough itself for at least 3 days with codeine and push gabapentin to 100 mg qid if tolerates   Since having purulent sputum with streaks of hemotysis will also cover for bronchitis/ sinusitis with augmentin x 10 days and f/u in 2 weeks with all meds in hand using a trust but verify approach to confirm accurate Medication  Reconciliation The principal here is that until we are certain that the  patients are doing what we've asked, it makes no sense to ask them to do more.      Reviewed:  The standardized cough guidelines published in Chest by Gerlene Burdock  Francesca Jewett in 2006 are still the best available and consist of a multiple step process (up to 12!) , not a single office visit,  and are intended  to address this problem logically,  with an alogrithm dependent on response to empiric treatment at  each progressive step  to determine a specific diagnosis with  minimal addtional testing needed. Therefore if adherence is an issue or can't be  accurately verified,  it's very unlikely the standard evaluation and treatment will be successful here.    Furthermore, response to therapy (other than acute cough suppression, which should only be used short term with avoidance of narcotic containing cough syrups if possible), can be a gradual process for which the patient is not likely to  perceive immediate benefit.  Unlike going to an eye doctor where the best perscription is almost always the first one and is immediately effective, this is almost never the case in the management of chronic cough syndromes. Therefore the patient needs to commit up front to consistently adhere to recommendations  for up to 6 weeks of therapy directed at the likely underlying problem(s) before the response can be reasonably evaluated.

## 2018-01-09 NOTE — Assessment & Plan Note (Signed)
Assoc with mild cough reflex on exam 01/08/2018   rec otc ear wax removal then f/u in 2 weeks with NP to be sure cleared.    Total time devoted to counseling  > 50 % of initial 60 min office visit:  review case with pt/ discussion of options/alternatives/ personally creating written customized instructions  in presence of pt  then going over those specific  Instructions directly with the pt including how to use all of the meds but in particular covering each new medication in detail and the difference between the maintenance= "automatic" meds and the prns using an action plan format for the latter (If this problem/symptom => do that organization reading Left to right).  Please see AVS from this visit for a full list of these instructions which I personally wrote for this pt and  are unique to this visit.

## 2018-01-10 NOTE — Progress Notes (Signed)
Spoke with pt and notified of results per Dr. Wert. Pt verbalized understanding and denied any questions. 

## 2018-01-23 ENCOUNTER — Ambulatory Visit (INDEPENDENT_AMBULATORY_CARE_PROVIDER_SITE_OTHER): Payer: BLUE CROSS/BLUE SHIELD | Admitting: Adult Health

## 2018-01-23 ENCOUNTER — Encounter: Payer: Self-pay | Admitting: Adult Health

## 2018-01-23 DIAGNOSIS — R058 Other specified cough: Secondary | ICD-10-CM

## 2018-01-23 DIAGNOSIS — H6122 Impacted cerumen, left ear: Secondary | ICD-10-CM | POA: Diagnosis not present

## 2018-01-23 DIAGNOSIS — R05 Cough: Secondary | ICD-10-CM

## 2018-01-23 NOTE — Progress Notes (Signed)
Chart and office note reviewed in detail  > agree with a/p as outlined    

## 2018-01-23 NOTE — Assessment & Plan Note (Signed)
Ear irrigation with wax extraction on left . Tolerated well with no perceived complications  May use debrox As needed

## 2018-01-23 NOTE — Progress Notes (Signed)
@Patient  ID: Savannah Whitney, female    DOB: 1961/04/11, 57 y.o.   MRN: [id number]  Chief Complaint  Patient presents with  . Follow-up    cough     Referring provider: Elias Else, MD  HPI: 57 year old female never smoker seen for pulmonary consult 01/08/18  for chronic cough that began in the summer 2018. Medical hx significant for HTN,    TEST  CXR 12/2017 >NAD  Allergy profile 12/2017 >neg  EOS 0.1 /IgE 12   01/23/2018 Follow up ; Chronic Cough  Pt returns for a 2 week follow up.  Patient was seen last visit for a pulmonary consult for chronic cough is been present since summer 2018.  Patient had tried multiple treatments without any resolution of her cough.  Last visit patient was recommended to begin Neurontin 100 mg 4 times daily.  Continue on reflux regimen with Prilosec and Zantac.  Placed on a cough suppression regimen with Mucinex DM, Tessalon and Ultram.  Was treated with a 10-day course of Augmentin.  Patient says she is better.  She continues to have cough and congestion.  Feels that she is about 50% improved.  He does have some sinus congestion as well. Patient denies any hemoptysis, chest pain, orthopnea, edema. Complains that her ears are stopped up.  Was noted to have left sided wax impaction. Chest x-ray showed no acute process lungs were clear.  Allergy profile was negative.  Eosinophils were normal.  IgE was normal at 12.  Allergies  Allergen Reactions  . Macrobid [employer] Macro] Other (See Comments)    Severe headache  . Hydrocodone Nausea And Vomiting  . Demerol [Meperidine]     itching  . Latex     Skin rash with latex gloves   Ok with bandaids    Immunization History  Administered Date(s) Administered  . Influenza Split 02/05/2017    Past Medical History:  Diagnosis Date  . GERD (gastroesophageal reflux disease)   . High cholesterol   . Pain     Tobacco History: Social History   Tobacco Use  Smoking Status Never Smoker    Smokeless Tobacco Never Used   Counseling given: Not Answered   Outpatient Medications Prior to Visit  Medication Sig Dispense Refill  . gabapentin (NEURONTIN) 100 MG capsule Take 1 capsule (100 mg total) by mouth 4 (four) times daily. 120 capsule 2  . meloxicam (MOBIC) 15 MG tablet Take 15 mg by mouth daily.    . Multiple Minerals-Vitamins (CALCIUM-MAGNESIUM-ZINC-D3) TABS Take by mouth 2 (two) times daily.    Marland Kitchen MYRBETRIQ 50 MG TB24 tablet Take 50 mg by mouth daily.  3  . omeprazole (PRILOSEC) 40 MG capsule Take 40 mg by mouth 2 (two) times daily.     . polyethylene glycol (MIRALAX / GLYCOLAX) packet Take 17 g by mouth daily.    . pravastatin (PRAVACHOL) 40 MG tablet Take 40 mg by mouth every 7 (seven) days.     . ranitidine (ZANTAC) 300 MG tablet Take 1 tablet (300 mg total) by mouth at bedtime.    Marland Kitchen rOPINIRole (REQUIP) 1 MG tablet Take 1 mg by mouth at bedtime.    . traMADol (ULTRAM) 50 MG tablet Take by mouth every 6 (six) hours as needed.     No facility-administered medications prior to visit.      Review of Systems  Constitutional:   No  weight loss, night sweats,  Fevers, chills, fatigue, or  lassitude.  HEENT:   No  headaches,  Difficulty swallowing,  Tooth/dental problems, or  Sore throat,                No sneezing, itching, ear ache, + nasal congestion, post nasal drip,   CV:  No chest pain,  Orthopnea, PND, swelling in lower extremities, anasarca, dizziness, palpitations, syncope.   GI  No heartburn, indigestion, abdominal pain, nausea, vomiting, diarrhea, change in bowel habits, loss of appetite, bloody stools.   Resp:    No chest wall deformity  Skin: no rash or lesions.  GU: no dysuria, change in color of urine, no urgency or frequency.  No flank pain, no hematuria   MS:  No joint pain or swelling.  No decreased range of motion.  No back pain.    Physical Exam  BP 122/66 (BP Location: Left Arm, Cuff Size: Normal)   Pulse 92   Ht 5' 2.5" (1.588 m)   Wt  174 lb 9.6 oz (79.2 kg)   SpO2 98%   BMI 31.43 kg/m   GEN: A/Ox3; pleasant , NAD    HEENT:  [place]/AT,  Left wax impaction  NOSE-clear, THROAT-clear, no lesions, no postnasal drip or exudate noted.   NECK:  Supple w/ fair ROM; no JVD; normal carotid impulses w/o bruits; no thyromegaly or nodules palpated; no lymphadenopathy.    RESP  Clear  P & A; w/o, wheezes/ rales/ or rhonchi. no accessory muscle use, no dullness to percussion  CARD:  RRR, no m/r/g, no peripheral edema, pulses intact, no cyanosis or clubbing.  GI:   Soft & nt; nml bowel sounds; no organomegaly or masses detected.   Musco: Warm bil, no deformities or joint swelling noted.   Neuro: alert, no focal deficits noted.    Skin: Warm, no lesions or rashes    Lab Results:  CBC    Component Value Date/Time   WBC 5.9 01/08/2018 1632   RBC 4.48 01/08/2018 1632   HGB 13.0 01/08/2018 1632   HCT 39.2 01/08/2018 1632   PLT 282.0 01/08/2018 1632   MCV 87.4 01/08/2018 1632   MCHC 33.1 01/08/2018 1632   RDW 12.8 01/08/2018 1632   LYMPHSABS 2.0 01/08/2018 1632   MONOABS 0.6 01/08/2018 1632   EOSABS 0.1 01/08/2018 1632   BASOSABS 0.1 01/08/2018 1632    BMET No results found for: NA, K, CL, CO2, GLUCOSE, BUN, CREATININE, CALCIUM, GFRNONAA, GFRAA  BNP No results found for: BNP  ProBNP No results found for: PROBNP  Imaging: Dg Chest 2 View  Result Date: 01/01/2018 CLINICAL DATA:  Chronic cough for several months EXAM: CHEST - 2 VIEW COMPARISON:  None. FINDINGS: The heart size and mediastinal contours are within normal limits. Both lungs are clear. The visualized skeletal structures are unremarkable. IMPRESSION: No active cardiopulmonary disease. Electronically Signed   By: Alcide CleverMark  Lukens M.D.   On: 01/01/2018 21:40     Assessment & Plan:   Upper airway cough syndrome Chronic cough since Summer 2018 - workup has been unrevealing .  Improved with cough control regimen aimed at GERD/AR prevention and cough control  with gabapentin .  If still coughing on return consider spirometry or PFT on return  Patient's medications were reviewed today and patient education was given. Computerized medication calendar was adjusted/completed    Plan  Patient Instructions  May use Debrox over-the-counter for earwax as needed Follow medication calendar closely and bring to each visit.  Continue on cough suppression regimen as discussed.  Follow up with Dr. Sherene SiresWert  In 4  weeks and As needed          Excessive ear wax, left Ear irrigation with wax extraction on left . Tolerated well with no perceived complications  May use debrox As needed        Rubye Oaks, NP 01/23/2018

## 2018-01-23 NOTE — Patient Instructions (Signed)
May use Debrox over-the-counter for earwax as needed Follow medication calendar closely and bring to each visit.  Continue on cough suppression regimen as discussed.  Follow up with Dr. Sherene SiresWert  In 4 weeks and As needed

## 2018-01-23 NOTE — Assessment & Plan Note (Addendum)
Chronic cough since Summer 2018 - workup has been unrevealing .  Improved with cough control regimen aimed at GERD/AR prevention and cough control with gabapentin .  If still coughing on return consider spirometry or PFT on return  Patient's medications were reviewed today and patient education was given. Computerized medication calendar was adjusted/completed    Plan  Patient Instructions  May use Debrox over-the-counter for earwax as needed Follow medication calendar closely and bring to each visit.  Continue on cough suppression regimen as discussed.  Follow up with Dr. Sherene SiresWert  In 4 weeks and As needed

## 2018-01-28 ENCOUNTER — Telehealth: Payer: Self-pay | Admitting: Internal Medicine

## 2018-01-28 NOTE — Telephone Encounter (Signed)
Called and spoke with patient, she stated that she had her ears cleaned at her last OV. Since having her ears cleaned she has had trouble hearing. She finished all antibiotics over 3 weeks ago. She denies pain in ears and no drainage. She stated that her ears are "clogged" please advise on what she can do. Thank you.

## 2018-01-28 NOTE — Telephone Encounter (Signed)
No pain , fever or drainage . No tinnutis .  Crackling in ears , felt better briefly when she held her nose and blew out .  Recommend Allegra 180mg  daily for 1 week.  May try Sudafed 30mg  daily for 1-2 days As needed  Sinus congestion. Monitor b/p closely  Please contact office for sooner follow up if symptoms do not improve or worsen or seek emergency care

## 2018-02-04 ENCOUNTER — Encounter: Payer: Self-pay | Admitting: Pulmonary Disease

## 2018-02-04 ENCOUNTER — Ambulatory Visit (INDEPENDENT_AMBULATORY_CARE_PROVIDER_SITE_OTHER): Payer: BLUE CROSS/BLUE SHIELD | Admitting: Pulmonary Disease

## 2018-02-04 ENCOUNTER — Telehealth: Payer: Self-pay | Admitting: Pulmonary Disease

## 2018-02-04 VITALS — BP 124/76 | HR 78 | Ht 63.0 in | Wt 168.0 lb

## 2018-02-04 DIAGNOSIS — R058 Other specified cough: Secondary | ICD-10-CM

## 2018-02-04 DIAGNOSIS — R05 Cough: Secondary | ICD-10-CM

## 2018-02-04 DIAGNOSIS — H6122 Impacted cerumen, left ear: Secondary | ICD-10-CM | POA: Diagnosis not present

## 2018-02-04 DIAGNOSIS — H6981 Other specified disorders of Eustachian tube, right ear: Secondary | ICD-10-CM

## 2018-02-04 DIAGNOSIS — J309 Allergic rhinitis, unspecified: Secondary | ICD-10-CM | POA: Diagnosis not present

## 2018-02-04 MED ORDER — FAMOTIDINE 20 MG PO TABS: 20.0000 mg | ORAL_TABLET | Freq: Every day | ORAL | 0 refills | Status: DC

## 2018-02-04 MED ORDER — FLUTICASONE PROPIONATE 50 MCG/ACT NA SUSP: 2.0000 | Freq: Two times a day (BID) | NASAL | 3 refills | Status: AC

## 2018-02-04 NOTE — Progress Notes (Signed)
@Patient  ID: Savannah Whitney, female    DOB: 07-01-1960, 57 y.o.   MRN: [id number]  Chief Complaint  Patient presents with  . Acute Visit    Right ear pain and hearing loss.    Referring provider: Elias Else, MD  HPI:  58 year old female never smoker seen for pulmonary care consult on 01/08/2018 for chronic cough that began in the summer 2018. PMH: Hypertension Smoker/ Smoking History: Never smoker Maintenance:  none Pt of: Dr. Sherene Sires  Recent [facility] Encounters:   01/23/18 - OV - TP  Ears were flushed, large amount of cerumen from left ear canal  02/04/2018  - Visit   57 year old female patient presenting today for acute care visit.  Patient reporting that since completing Augmentin therapy she is had muffled hearing from right ear.  Patient reports occasional tinnitus from right ear.  Patient was recently seen on 01/23/2018 with TP and had her ears flushed.  Large amount of cerumen was in the left ear canal.  Patient reports occasional lightheadedness/dizziness, pretty consistent muffled hearing from right ear, occasional tinnitus.  Denies symptoms of room spinning, fever, sore throat, allergic rhinitis symptoms.  Patient does report also that for the past couple of months she has had continued eye drainage is a clear crust in the morning.  This happens every morning.  Patient reports she has not mentioned this to Dr. Sherene Sires, TP, or her eye doctor.  Patient recently called in last week to report the symptoms.  Patient was encouraged to start Allegra which she has.  Patient has also had 2 days worth of Sudafed.  Patient reports that neither of these seem to improve symptoms.  Patient does report continued breakthrough indigestion and heartburn.  Patient reports adherence to Protonix and Zantac at night.  Patient is not following a GERD specific diet.   Tests:   CXR 12/2017 >NAD  Allergy profile 12/2017 >neg  EOS 0.1 /IgE 12   Chart Review:     Specialty Problems      Pulmonary Problems   Upper airway cough syndrome    Onset summer 2018  - Allergy profile 01/08/2018 >  Eos 0.1 /  IgE 12  RAST neg   - cyclical cough rx 01/08/2018        Allergic rhinitis    Allergy profile 12/2017 >neg  EOS 0.1 /IgE 12          Allergies  Allergen Reactions  . Macrobid [employer] Macro] Other (See Comments)    Severe headache  . Hydrocodone Nausea And Vomiting  . Demerol [Meperidine]     itching  . Latex     Skin rash with latex gloves   Ok with bandaids    Immunization History  Administered Date(s) Administered  . Influenza Split 02/05/2017   Patient would like to wait on flu vaccine today  Past Medical History:  Diagnosis Date  . GERD (gastroesophageal reflux disease)   . High cholesterol   . Pain     Tobacco History: Social History   Tobacco Use  Smoking Status Never Smoker  Smokeless Tobacco Never Used   Counseling given: Yes  Continue not smoking  Outpatient Encounter Medications as of 02/04/2018  Medication Sig  . gabapentin (NEURONTIN) 100 MG capsule Take 1 capsule (100 mg total) by mouth 4 (four) times daily.  . meloxicam (MOBIC) 15 MG tablet Take 15 mg by mouth daily.  . Multiple Minerals-Vitamins (CALCIUM-MAGNESIUM-ZINC-D3) TABS Take by mouth 2 (two) times daily.  Marland Kitchen MYRBETRIQ  50 MG TB24 tablet Take 50 mg by mouth daily.  Marland Kitchen omeprazole (PRILOSEC) 40 MG capsule Take 40 mg by mouth 2 (two) times daily.   . polyethylene glycol (MIRALAX / GLYCOLAX) packet Take 17 g by mouth daily.  . pravastatin (PRAVACHOL) 40 MG tablet Take 40 mg by mouth every 7 (seven) days.   Marland Kitchen rOPINIRole (REQUIP) 1 MG tablet Take 1 mg by mouth at bedtime.  . traMADol (ULTRAM) 50 MG tablet Take by mouth every 6 (six) hours as needed.  . [DISCONTINUED] ranitidine (ZANTAC) 300 MG tablet Take 1 tablet (300 mg total) by mouth at bedtime.  . famotidine (PEPCID) 20 MG tablet Take 1 tablet (20 mg total) by mouth at bedtime.  . fluticasone (FLONASE) 50 MCG/ACT  nasal spray Place 2 sprays into both nostrils 2 (two) times daily.   No facility-administered encounter medications on file as of 02/04/2018.      Review of Systems  Review of Systems  Constitutional: Positive for fatigue. Negative for chills, fever and unexpected weight change.  HENT: Positive for hearing loss (muffled hearing from Right Ear ) and tinnitus (right ear ). Negative for congestion, ear pain, postnasal drip, sinus pressure and sinus pain.   Eyes: Positive for discharge (in the morning, dry crusted discharge ).  Respiratory: Positive for cough (chronic, about 50-60 percent better, yellow/clear mucous) and shortness of breath. Negative for chest tightness and wheezing.   Cardiovascular: Negative for chest pain and palpitations.  Gastrointestinal: Negative for blood in stool, diarrhea, nausea and vomiting.  Genitourinary: Negative for dysuria, frequency and urgency.  Musculoskeletal: Negative for arthralgias.  Skin: Negative for color change.  Allergic/Immunologic: Negative for environmental allergies and food allergies.  Neurological: Positive for dizziness (occasionally ). Negative for light-headedness and headaches.  Psychiatric/Behavioral: Negative for dysphoric mood. The patient is not nervous/anxious.   All other systems reviewed and are negative.    Physical Exam  BP 124/76 (BP Location: Left Arm, Cuff Size: Normal)   Pulse 78   Ht 5\' 3"  (1.6 m)   Wt 168 lb (76.2 kg)   SpO2 97%   BMI 29.76 kg/m   Wt Readings from Last 5 Encounters:  02/04/18 168 lb (76.2 kg)  01/23/18 174 lb 9.6 oz (79.2 kg)  01/08/18 174 lb (78.9 kg)  04/28/14 180 lb (81.6 kg)  04/20/14 180 lb (81.6 kg)    Physical Exam  Constitutional: She is oriented to person, place, and time and well-developed, well-nourished, and in no distress. No distress.  HENT:  Head: Normocephalic and atraumatic.  Right Ear: Hearing, external ear and ear canal normal.  Left Ear: Hearing, tympanic membrane,  external ear and ear canal normal.  Ears:  Nose: Mucosal edema present. Right sinus exhibits no maxillary sinus tenderness and no frontal sinus tenderness. Left sinus exhibits no maxillary sinus tenderness and no frontal sinus tenderness.  Mouth/Throat: Uvula is midline and oropharynx is clear and moist. No oropharyngeal exudate.  Right TM with effusion, without infection, +post nasal drip   Eyes: Pupils are equal, round, and reactive to light.  Neck: Normal range of motion. Neck supple. No JVD present.  Cardiovascular: Normal rate, regular rhythm and normal heart sounds.  Pulmonary/Chest: Effort normal and breath sounds normal. No accessory muscle usage. No respiratory distress. She has no decreased breath sounds. She has no wheezes. She has no rhonchi.  Abdominal: Soft. Bowel sounds are normal. There is no tenderness.  Musculoskeletal: Normal range of motion. She exhibits no edema.  Lymphadenopathy:  She has no cervical adenopathy.  Neurological: She is alert and oriented to person, place, and time. Gait normal.  Skin: Skin is warm and dry. She is not diaphoretic. No erythema.  Psychiatric: Mood, memory, affect and judgment normal.  Nursing note and vitals reviewed.     Lab Results:  CBC    Component Value Date/Time   WBC 5.9 01/08/2018 1632   RBC 4.48 01/08/2018 1632   HGB 13.0 01/08/2018 1632   HCT 39.2 01/08/2018 1632   PLT 282.0 01/08/2018 1632   MCV 87.4 01/08/2018 1632   MCHC 33.1 01/08/2018 1632   RDW 12.8 01/08/2018 1632   LYMPHSABS 2.0 01/08/2018 1632   MONOABS 0.6 01/08/2018 1632   EOSABS 0.1 01/08/2018 1632   BASOSABS 0.1 01/08/2018 1632    BMET No results found for: NA, K, CL, CO2, GLUCOSE, BUN, CREATININE, CALCIUM, GFRNONAA, GFRAA  BNP No results found for: BNP  ProBNP No results found for: PROBNP  Imaging: No results found.    Assessment & Plan:   Pleasant 57 year old patient seen office visit today.  Suspect patient has right eustachian tube  dysfunction probably after potential URI the beginning of September.  Both ear canals are clear of cerumen.  Minor amount of cerumen on right TM.  Right TM is with effusion.  We will have patient continue daily antihistamine, will add Flonase 1 spray each nostril daily.  Suggested to patient she could do nasal saline rinses daily.  Patient reports she will think about starting that.  We will have patient follow-up closer with our office next 2 to 3 weeks.  If symptoms are not improving patient could return back to her ENT who she is previously established with.  Unfortunately patient is still having breakthrough GERD.  Will switch patient to Pepcid 20 mg at night from her Zantac due to recent recall of Zantac.  Emphasized the importance of appropriate diet for GERD.  Provided GERD diet information on discharge paperwork today.  Reviewed with patient.  Upper airway cough syndrome Continue Allegra daily Can add Flonase 1 spray each nostril daily for congestion allergic rhinitis symptoms Can also do nasal saline rinses as needed  Contact our office if symptoms continue to not be improved or if they worsen  Continue omeprazole as prescribed We will stop Zantac >>>We will add Pepcid 20 mg at night >>>Review GERD literature below  Follow-up with Dr. Sherene Sires in the next 2 to 3 weeks    Eustachian tube dysfunction, right  Continue Allegra daily Sudafed 30mg  daily for next 3-4 days As needed  Sinus congestion. Monitor b/p closely  Can add Flonase 1 spray each nostril daily for congestion allergic rhinitis symptoms Can also do nasal saline rinses as needed Could follow-up with ENT if symptoms worsen  Contact our office if symptoms continue to not be improved or if they worsen  Follow-up with Dr. Sherene Sires in the next 2 to 3 weeks    Excessive ear wax, left Canal clear on exam today  Allergic rhinitis Continue Allegra daily Add Flonase 1 spray each nostril daily   This appointment was 27  minutes along with her 50% of the time direct face-to-face patient care, assessment, plan of care follow-up.   Coral Ceo, NP 02/04/2018

## 2018-02-04 NOTE — Assessment & Plan Note (Signed)
Continue Allegra daily Add Flonase 1 spray each nostril daily

## 2018-02-04 NOTE — Assessment & Plan Note (Signed)
  Continue Allegra daily Sudafed 30mg  daily for next 3-4 days As needed  Sinus congestion. Monitor b/p closely  Can add Flonase 1 spray each nostril daily for congestion allergic rhinitis symptoms Can also do nasal saline rinses as needed Could follow-up with ENT if symptoms worsen  Contact our office if symptoms continue to not be improved or if they worsen  Follow-up with Dr. Sherene Sires in the next 2 to 3 weeks

## 2018-02-04 NOTE — Assessment & Plan Note (Signed)
Continue Allegra daily Can add Flonase 1 spray each nostril daily for congestion allergic rhinitis symptoms Can also do nasal saline rinses as needed  Contact our office if symptoms continue to not be improved or if they worsen  Continue omeprazole as prescribed We will stop Zantac >>>We will add Pepcid 20 mg at night >>>Review GERD literature below  Follow-up with Dr. Sherene Sires in the next 2 to 3 weeks

## 2018-02-04 NOTE — Telephone Encounter (Signed)
ATC pt, no answer. Left message for pt to call back.  Famotidine is OTC.

## 2018-02-04 NOTE — Progress Notes (Signed)
Chart and office note reviewed in detail  > agree with a/p as outlined    

## 2018-02-04 NOTE — Assessment & Plan Note (Signed)
Canal clear on exam today

## 2018-02-04 NOTE — Patient Instructions (Addendum)
Suspect you have a effusion in the right ear, does not appear to be infected  Continue Allegra daily Sudafed 30mg  daily for next 3-4 days As needed  Sinus congestion. Monitor b/p closely  Can add Flonase 1 spray each nostril daily for congestion allergic rhinitis symptoms Can also do nasal saline rinses as needed Could follow-up with ENT if symptoms worsen  Contact our office if symptoms continue to not be improved or if they worsen  Continue omeprazole as prescribed We will stop Zantac >>>We will add Pepcid 20 mg at night >>>Review GERD literature below  Follow-up with Dr. Sherene Sires in the next 2 to 3 weeks    It is flu season:   >>>Remember to be washing your hands regularly, using hand sanitizer, be careful to use around herself with has contact with people who are sick will increase her chances of getting sick yourself. >>> Best ways to protect herself from the flu: Receive the yearly flu vaccine, practice good hand hygiene washing with soap and also using hand sanitizer when available, eat a nutritious meals, get adequate rest, hydrate appropriately    As of 03/11/2018 we will be moving! We will no longer be at our [place] location.   Our new address and phone number will be:  [address] [place], [place] [address] Telephone number: [phone]   Please contact the office if your symptoms worsen or you have concerns that you are not improving.   Thank you for choosing [facility] for your healthcare, and for allowing [place] to partner with you on your healthcare journey. I am thankful to be able to provide care to you today.   Elisha Headland FNP-C    Food Choices for Gastroesophageal Reflux Disease, Adult When you have gastroesophageal reflux disease (GERD), the foods you eat and your eating habits are very important. Choosing the right foods can help ease your discomfort. What guidelines do I need to follow?  Choose fruits, vegetables, whole grains, and  low-fat dairy products.  Choose low-fat meat, fish, and poultry.  Limit fats such as oils, salad dressings, butter, nuts, and avocado.  Keep a food diary. This helps you identify foods that cause symptoms.  Avoid foods that cause symptoms. These may be different for everyone.  Eat small meals often instead of 3 large meals a day.  Eat your meals slowly, in a place where you are relaxed.  Limit fried foods.  Cook foods using methods other than frying.  Avoid drinking alcohol.  Avoid drinking large amounts of liquids with your meals.  Avoid bending over or lying down until 2-3 hours after eating. What foods are not recommended? These are some foods and drinks that may make your symptoms worse: Vegetables Tomatoes. Tomato juice. Tomato and spaghetti sauce. Chili peppers. Onion and garlic. Horseradish. Fruits Oranges, grapefruit, and lemon (fruit and juice). Meats High-fat meats, fish, and poultry. This includes hot dogs, ribs, ham, sausage, salami, and bacon. Dairy Whole milk and chocolate milk. Sour cream. Cream. Butter. Ice cream. Cream cheese. Drinks Coffee and tea. Bubbly (carbonated) drinks or energy drinks. Condiments Hot sauce. Barbecue sauce. Sweets/Desserts Chocolate and cocoa. Donuts. Peppermint and spearmint. Fats and Oils High-fat foods. This includes [place] fries and potato chips. Other Vinegar. Strong spices. This includes black pepper, white pepper, red pepper, cayenne, curry powder, cloves, ginger, and chili powder. The items listed above may not be a complete list of foods and drinks to avoid. Contact your dietitian for more information. This information  is not intended to replace advice given to you by your health care provider. Make sure you discuss any questions you have with your health care provider. Document Released: 10/24/2011 Document Revised: 09/30/2015 Document Reviewed: 02/26/2013 Elsevier Interactive Patient Education  2017 Elsevier  Inc.     Gastroesophageal Reflux Disease, Adult Normally, food travels down the esophagus and stays in the stomach to be digested. If a person has gastroesophageal reflux disease (GERD), food and stomach acid move back up into the esophagus. When this happens, the esophagus becomes sore and swollen (inflamed). Over time, GERD can make small holes (ulcers) in the lining of the esophagus. Follow these instructions at home: Diet  Follow a diet as told by your doctor. You may need to avoid foods and drinks such as: ? Coffee and tea (with or without caffeine). ? Drinks that contain alcohol. ? Energy drinks and sports drinks. ? Carbonated drinks or sodas. ? Chocolate and cocoa. ? Peppermint and mint flavorings. ? Garlic and onions. ? Horseradish. ? Spicy and acidic foods, such as peppers, chili powder, curry powder, vinegar, hot sauces, and BBQ sauce. ? Citrus fruit juices and citrus fruits, such as oranges, lemons, and limes. ? Tomato-based foods, such as red sauce, chili, salsa, and pizza with red sauce. ? Fried and fatty foods, such as donuts, french fries, potato chips, and high-fat dressings. ? High-fat meats, such as hot dogs, rib eye steak, sausage, ham, and bacon. ? High-fat dairy items, such as whole milk, butter, and cream cheese.  Eat small meals often. Avoid eating large meals.  Avoid drinking large amounts of liquid with your meals.  Avoid eating meals during the 2-3 hours before bedtime.  Avoid lying down right after you eat.  Do not exercise right after you eat. General instructions  Pay attention to any changes in your symptoms.  Take over-the-counter and prescription medicines only as told by your doctor. Do not take aspirin, ibuprofen, or other NSAIDs unless your doctor says it is okay.  Do not use any tobacco products, including cigarettes, chewing tobacco, and e-cigarettes. If you need help quitting, ask your doctor.  Wear loose clothes. Do not wear anything  tight around your waist.  Raise (elevate) the head of your bed about 6 inches (15 cm).  Try to lower your stress. If you need help doing this, ask your doctor.  If you are overweight, lose an amount of weight that is healthy for you. Ask your doctor about a safe weight loss goal.  Keep all follow-up visits as told by your doctor. This is important. Contact a doctor if:  You have new symptoms.  You lose weight and you do not know why it is happening.  You have trouble swallowing, or it hurts to swallow.  You have wheezing or a cough that keeps happening.  Your symptoms do not get better with treatment.  You have a hoarse voice. Get help right away if:  You have pain in your arms, neck, jaw, teeth, or back.  You feel sweaty, dizzy, or light-headed.  You have chest pain or shortness of breath.  You throw up (vomit) and your throw up looks like blood or coffee grounds.  You pass out (faint).  Your poop (stool) is bloody or black.  You cannot swallow, drink, or eat. This information is not intended to replace advice given to you by your health care provider. Make sure you discuss any questions you have with your health care provider. Document Released: 10/11/2007 Document Revised:  09/30/2015 Document Reviewed: 08/19/2014 Elsevier Interactive Patient Education  [employer].

## 2018-02-05 MED ORDER — FAMOTIDINE 20 MG PO TABS: 20.0000 mg | ORAL_TABLET | Freq: Every day | ORAL | 5 refills | Status: AC

## 2018-02-05 NOTE — Telephone Encounter (Signed)
Spoke with pt, she would like an RX sent into the pharmacy because sometimes her insurance will pay for it. Rx sent in. Nothing further is needed.

## 2018-02-13 ENCOUNTER — Other Ambulatory Visit: Payer: Self-pay | Admitting: Radiology

## 2018-02-14 ENCOUNTER — Institutional Professional Consult (permissible substitution): Payer: BLUE CROSS/BLUE SHIELD | Admitting: Pulmonary Disease

## 2018-02-25 ENCOUNTER — Other Ambulatory Visit: Payer: Self-pay | Admitting: Family Medicine

## 2018-02-25 DIAGNOSIS — N644 Mastodynia: Secondary | ICD-10-CM

## 2018-02-26 ENCOUNTER — Ambulatory Visit (INDEPENDENT_AMBULATORY_CARE_PROVIDER_SITE_OTHER): Payer: BLUE CROSS/BLUE SHIELD | Admitting: Internal Medicine

## 2018-02-26 ENCOUNTER — Encounter: Payer: Self-pay | Admitting: Internal Medicine

## 2018-02-26 VITALS — BP 126/84 | HR 89 | Ht 63.0 in | Wt 166.0 lb

## 2018-02-26 DIAGNOSIS — R05 Cough: Secondary | ICD-10-CM | POA: Diagnosis not present

## 2018-02-26 DIAGNOSIS — R058 Other specified cough: Secondary | ICD-10-CM

## 2018-02-26 NOTE — Patient Instructions (Addendum)
No change in medications  Except ok to try instead of allergra as needed  For drainage / throat tickle try take CHLORPHENIRAMINE  4 mg (chlortab) - take one every 4 hours as needed - available over the counter- may cause drowsiness so start with just a bedtime dose or two and see how you tolerate it before trying in daytime    Follow up with ENT and here as needed

## 2018-02-26 NOTE — Progress Notes (Signed)
Savannah Whitney, female    DOB: 20-Oct-1960,     MRN: [id number]     Brief patient profile:  69 yowf  Never smoker / never any resp problems despite problem with spring time episodes of  eyes watering x around 2014 responded to [place] then onset then summer 2018 intermittent cough not assoc with the watery eyes which variably worsened since then to daily pattern assoc with overt sensation of gerd to point  Of vomiting (gerd actually dates back to 2009 but much worse once started coughing) so referred to pulmonary clinic 01/08/2018 by Dr   Moreen Fowler s/p Marcellino(ENT)  w/u c/w gerd 08/21/17     History of Present Illness  01/08/2018  Pulmonary Consultation  Chief Complaint  Patient presents with  . Pulmonary Consult    Referred by Dr. Antony Contras. Pt c/o cough off and on for the past year, worse for the past 4 months. Cough is prod with yellow to brown sputum, and seems worse in the am and early evening.   Dyspnea:  Not limited by breathing from desired activities   Cough: esp x 4 months esp early am/ p supper steaky brb/ no epistaxis but sensation of pnds in certain positions Sleep: 2 pillows plus hob is 4 in up  SABA use: never  Kouffman Reflux v Neurogenic Cough Differentiator Reflux Comments  Do you awaken from a sound sleep coughing violently?                            With trouble breathing? rarely   Do you have choking episodes when you cannot  Get enough air, gasping for air ?              Yes, freq   Do you usually cough when you lie down into  The bed, or when you just lie down to rest ?                          no   Do you usually cough after meals or eating?         no   Do you cough when (or after) you bend over?    Yes   GERD SCORE     Kouffman Reflux v Neurogenic Cough Differentiator Neurogenic   Do you more-or-less cough all day long?  sporadic    Does change of temperature make you cough? yes   Does laughing or chuckling cause you to cough? No    Do fumes (perfume,  automobile fumes, burned  Toast, etc.,) cause you to cough ?      no   Does speaking, singing, or talking on the phone cause you to cough   ?               Yes    Neurogenic/Airway score     rec Ear wax remover kit is over counter  Augmentin 875 mg take one pill twice daily  X 10 days - take at breakfast and supper with large glass of water.  It would help reduce the usual side effects (diarrhea and yeast infections) if you ate cultured yogurt at lunch.  Omeprazole 40 mg Take 30- 60 min before your first and last meals of the day  Zantac 300 mg one hour before bedtime  Gabapentin 100 mg four times a day  Mucinex dm 1200 mg every hours and supplement  With tramadol  up to every 4 hours  GERD  See [name] or [name]   In 2 weeks to have your ear checked and set up your follow up with me but bring all your medications including over the counter meds with you.     02/26/2018  f/u ov/Keierra Nudo re: chronic cough / did not purchase chlortabs  Chief Complaint  Patient presents with  . Follow-up    Cough is much improved. She denies any new co's.    Dyspnea: Not limited by breathing from desired activities    Cough: after supper and some at hs  Sleeping: sleeping on side 10 degrees hob SABA use: none  02: none     No obvious day to day or daytime variability or assoc excess/ purulent sputum or mucus plugs or hemoptysis or cp or chest tightness, subjective wheeze or overt sinus or hb symptoms.   Sleeping as above  without nocturnal  or early am exacerbation  of respiratory  c/o's or need for noct saba. Also denies any obvious fluctuation of symptoms with weather or environmental changes or other aggravating or alleviating factors except as outlined above   No unusual exposure hx or h/o childhood pna/ asthma or knowledge of premature birth.  Current Allergies, Complete Past Medical History, Past Surgical History, Family History, and Social History were reviewed in [employer]  record.  ROS  The following are not active complaints unless bolded Hoarseness, sore throat, dysphagia, dental problems, itching, sneezing,  nasal congestion or discharge of excess mucus or purulent secretions, ear ache,   fever, chills, sweats, unintended wt loss or wt gain, classically pleuritic or exertional cp,  orthopnea pnd or arm/hand swelling  or leg swelling, presyncope, palpitations, abdominal pain, anorexia, nausea, vomiting, diarrhea  or change in bowel habits or change in bladder habits, change in stools or change in urine, dysuria, hematuria,  rash, arthralgias, visual complaints, headache, numbness, weakness or ataxia or problems with walking or coordination,  change in mood or  memory.        Current Meds  Medication Sig  . amoxicillin-clavulanate (AUGMENTIN) 875-125 MG tablet Take 1 tablet by mouth 3 (three) times daily.  . benzonatate (TESSALON) 100 MG capsule Take 100 mg by mouth 3 (three) times daily as needed for cough.  . bismuth subsalicylate (PEPTO BISMOL) 262 MG/15ML suspension Take 30 mLs by mouth every 6 (six) hours as needed.  . Cholecalciferol (VITAMIN D3) 10000 units capsule Take 10,000 Units by mouth daily.  Marland Kitchen dextromethorphan-guaiFENesin (MUCINEX DM) 30-600 MG 12hr tablet Take 2 tablets by mouth 2 (two) times daily as needed for cough.  . famotidine (PEPCID) 20 MG tablet Take 1 tablet (20 mg total) by mouth at bedtime.  . fexofenadine (ALLEGRA) 180 MG tablet Take 180 mg by mouth daily as needed for allergies or rhinitis.  . fluticasone (FLONASE) 50 MCG/ACT nasal spray Place 2 sprays into both nostrils 2 (two) times daily.  Marland Kitchen gabapentin (NEURONTIN) 100 MG capsule Take 1 capsule (100 mg total) by mouth 4 (four) times daily. (Patient taking differently: Take 100 mg by mouth 3 (three) times daily. )  . Hypertonic Nasal Wash (SINUS RINSE BOTTLE KIT NA) Place into the nose daily.  . meloxicam (MOBIC) 15 MG tablet Take 15 mg by mouth daily.  . Multiple Minerals-Vitamins  (CALCIUM-MAGNESIUM-ZINC-D3) TABS Take by mouth 2 (two) times daily.  Marland Kitchen MYRBETRIQ 50 MG TB24 tablet Take 50 mg by mouth daily.  Marland Kitchen omeprazole (PRILOSEC) 40 MG capsule Take 40 mg  by mouth 2 (two) times daily.   . ondansetron (ZOFRAN) 4 MG tablet Take 4 mg by mouth every 8 (eight) hours as needed for nausea or vomiting.  . phenylephrine (SUDAFED PE) 10 MG TABS tablet Take 10 mg by mouth every 4 (four) hours as needed.  . polyethylene glycol (MIRALAX / GLYCOLAX) packet Take 17 g by mouth daily.  . pravastatin (PRAVACHOL) 40 MG tablet Take 40 mg by mouth every 7 (seven) days.   . Probiotic Product (PROBIOTIC-10) CAPS Take by mouth.  . pseudoephedrine (SUDAFED) 30 MG tablet Take 30 mg by mouth every 4 (four) hours as needed for congestion.  Marland Kitchen rOPINIRole (REQUIP) 1 MG tablet Take 1 mg by mouth at bedtime.  . traMADol (ULTRAM) 50 MG tablet Take by mouth every 6 (six) hours as needed.                   Objective:    amb wf nad   Wt Readings from Last 3 Encounters:  02/26/18 166 lb (75.3 kg)  02/04/18 168 lb (76.2 kg)  01/23/18 174 lb 9.6 oz (79.2 kg)     Vital signs reviewed - Note on arrival 02 sats  98% on RA       HEENT: nl dentition, turbinates bilaterally, and oropharynx. Nl external ear canals without cough reflex   NECK :  without JVD/Nodes/TM/ nl carotid upstrokes bilaterally   LUNGS: no acc muscle use,  Nl contour chest which is clear to A and P bilaterally without cough on insp or exp maneuvers   CV:  RRR  no s3 or murmur or increase in P2, and no edema   ABD:  soft and nontender with nl inspiratory excursion in the supine position. No bruits or organomegaly appreciated, bowel sounds nl  MS:  Nl gait/ ext warm without deformities, calf tenderness, cyanosis or clubbing No obvious joint restrictions   SKIN: warm and dry without lesions    NEURO:  alert, approp, nl sensorium with  no motor or cerebellar deficits apparent.            Assessment

## 2018-02-27 ENCOUNTER — Ambulatory Visit
Admission: RE | Admit: 2018-02-27 | Discharge: 2018-02-27 | Disposition: A | Discharge status: Home / Self Care | Payer: BLUE CROSS/BLUE SHIELD | Source: Ambulatory Visit | Attending: Family Medicine | Admitting: Family Medicine

## 2018-02-27 ENCOUNTER — Ambulatory Visit: Admission: RE | Admit: 2018-02-27 | Payer: BLUE CROSS/BLUE SHIELD | Source: Ambulatory Visit

## 2018-02-27 ENCOUNTER — Encounter: Payer: Self-pay | Admitting: Internal Medicine

## 2018-02-27 DIAGNOSIS — N644 Mastodynia: Secondary | ICD-10-CM

## 2018-02-27 NOTE — Assessment & Plan Note (Signed)
Onset summer 2018  - Allergy profile 01/08/2018 >  Eos 0.1 /  IgE 12  RAST neg   - cyclical cough rx 01/08/2018  > improved 02/26/2018 but still with sense of pnds esp p supper  - 02/26/2018 rec 1st gen H1 blockers per guidelines  And ent f/u planned  No evidence at all of asthma and all evidence points to Upper airway cough syndrome (previously labeled PNDS),  is so named because it's frequently impossible to sort out how much is  CR/sinusitis with freq throat clearing (which can be related to primary GERD)   vs  causing  secondary (" extra esophageal")  GERD from wide swings in gastric pressure that occur with throat clearing, often  promoting self use of mint and menthol lozenges that reduce the lower esophageal sphincter tone and exacerbate the problem further in a cyclical fashion.   These are the same pts (now being labeled as having "irritable larynx syndrome" by some cough centers) who not infrequently have a history of having failed to tolerate ace inhibitors,  dry powder inhalers or biphosphonates or report having atypical/extraesophageal reflux symptoms that don't respond to standard doses of PPI  and are easily confused as having aecopd or asthma flares by even experienced allergists/ pulmonologists (myself included).    rec add 1st gen H1 blockers per guidelines  And f/u here prn

## 2018-03-01 ENCOUNTER — Other Ambulatory Visit: Payer: Self-pay | Admitting: Family Medicine

## 2018-03-05 ENCOUNTER — Other Ambulatory Visit: Payer: Self-pay | Admitting: Family Medicine

## 2018-03-05 DIAGNOSIS — N6320 Unspecified lump in the left breast, unspecified quadrant: Secondary | ICD-10-CM

## 2018-03-07 ENCOUNTER — Ambulatory Visit
Admission: RE | Admit: 2018-03-07 | Discharge: 2018-03-07 | Disposition: A | Discharge status: Home / Self Care | Payer: BLUE CROSS/BLUE SHIELD | Source: Ambulatory Visit | Attending: Family Medicine | Admitting: Family Medicine

## 2018-03-07 DIAGNOSIS — N6320 Unspecified lump in the left breast, unspecified quadrant: Secondary | ICD-10-CM

## 2018-03-07 MED ORDER — GADOBUTROL 1 MMOL/ML IV SOLN
8.0000 mL | Freq: Once | INTRAVENOUS | Status: AC | PRN
  Administered 2018-03-07: 8 mL via INTRAVENOUS

## 2018-03-13 ENCOUNTER — Other Ambulatory Visit: Payer: Self-pay | Admitting: General Surgery

## 2018-03-19 ENCOUNTER — Encounter (HOSPITAL_BASED_OUTPATIENT_CLINIC_OR_DEPARTMENT_OTHER): Payer: Self-pay | Admitting: *Deleted

## 2018-03-19 ENCOUNTER — Other Ambulatory Visit: Payer: Self-pay

## 2018-03-22 NOTE — Progress Notes (Signed)
Ensure pre surgical drink given to patient with instructions to finish by 0815 DOS. Patient verbalized understanding.

## 2018-03-26 ENCOUNTER — Encounter (HOSPITAL_BASED_OUTPATIENT_CLINIC_OR_DEPARTMENT_OTHER)
Admission: RE | Disposition: A | Discharge status: Home / Self Care | Payer: Self-pay | Source: Ambulatory Visit | Attending: General Surgery

## 2018-03-26 ENCOUNTER — Other Ambulatory Visit: Payer: Self-pay

## 2018-03-26 ENCOUNTER — Ambulatory Visit (HOSPITAL_BASED_OUTPATIENT_CLINIC_OR_DEPARTMENT_OTHER): Payer: BLUE CROSS/BLUE SHIELD | Admitting: Anesthesiology

## 2018-03-26 ENCOUNTER — Ambulatory Visit (HOSPITAL_BASED_OUTPATIENT_CLINIC_OR_DEPARTMENT_OTHER)
Admission: RE | Admit: 2018-03-26 | Discharge: 2018-03-26 | Disposition: A | Discharge status: Home / Self Care | Payer: BLUE CROSS/BLUE SHIELD | Source: Ambulatory Visit | Attending: General Surgery | Admitting: General Surgery

## 2018-03-26 ENCOUNTER — Encounter (HOSPITAL_BASED_OUTPATIENT_CLINIC_OR_DEPARTMENT_OTHER): Payer: Self-pay

## 2018-03-26 DIAGNOSIS — Z8042 Family history of malignant neoplasm of prostate: Secondary | ICD-10-CM | POA: Diagnosis not present

## 2018-03-26 DIAGNOSIS — Z86010 Personal history of colonic polyps: Secondary | ICD-10-CM | POA: Diagnosis not present

## 2018-03-26 DIAGNOSIS — Z888 Allergy status to other drugs, medicaments and biological substances status: Secondary | ICD-10-CM | POA: Diagnosis not present

## 2018-03-26 DIAGNOSIS — Z79899 Other long term (current) drug therapy: Secondary | ICD-10-CM | POA: Diagnosis not present

## 2018-03-26 DIAGNOSIS — Z8249 Family history of ischemic heart disease and other diseases of the circulatory system: Secondary | ICD-10-CM | POA: Diagnosis not present

## 2018-03-26 DIAGNOSIS — N6453 Retraction of nipple: Secondary | ICD-10-CM | POA: Insufficient documentation

## 2018-03-26 DIAGNOSIS — N644 Mastodynia: Secondary | ICD-10-CM | POA: Diagnosis not present

## 2018-03-26 DIAGNOSIS — Z836 Family history of other diseases of the respiratory system: Secondary | ICD-10-CM | POA: Insufficient documentation

## 2018-03-26 DIAGNOSIS — Z820 Family history of epilepsy and other diseases of the nervous system: Secondary | ICD-10-CM | POA: Insufficient documentation

## 2018-03-26 DIAGNOSIS — N6320 Unspecified lump in the left breast, unspecified quadrant: Secondary | ICD-10-CM | POA: Insufficient documentation

## 2018-03-26 DIAGNOSIS — K219 Gastro-esophageal reflux disease without esophagitis: Secondary | ICD-10-CM | POA: Diagnosis not present

## 2018-03-26 HISTORY — DX: Major depressive disorder, single episode, unspecified: F32.9

## 2018-03-26 HISTORY — DX: Anxiety disorder, unspecified: F41.9

## 2018-03-26 HISTORY — DX: Depression, unspecified: F32.A

## 2018-03-26 HISTORY — PX: BREAST BIOPSY: SHX20

## 2018-03-26 SURGERY — BREAST BIOPSY
Anesthesia: General | Site: Breast | Laterality: Left

## 2018-03-26 MED ORDER — ONDANSETRON HCL 4 MG/2ML IJ SOLN
INTRAMUSCULAR | Status: AC
  Filled 2018-03-26: qty 2

## 2018-03-26 MED ORDER — GABAPENTIN 100 MG PO CAPS
100.0000 mg | ORAL_CAPSULE | ORAL | Status: AC
  Administered 2018-03-26: 100 mg via ORAL

## 2018-03-26 MED ORDER — ACETAMINOPHEN 500 MG PO TABS
ORAL_TABLET | ORAL | Status: AC
  Filled 2018-03-26: qty 2

## 2018-03-26 MED ORDER — MIDAZOLAM HCL 2 MG/2ML IJ SOLN
1.0000 mg | INTRAMUSCULAR | Status: DC | PRN
  Administered 2018-03-26: 2 mg via INTRAVENOUS

## 2018-03-26 MED ORDER — CEFAZOLIN SODIUM-DEXTROSE 2-4 GM/100ML-% IV SOLN
2.0000 g | INTRAVENOUS | Status: AC
  Administered 2018-03-26: 2 g via INTRAVENOUS

## 2018-03-26 MED ORDER — DEXAMETHASONE SODIUM PHOSPHATE 10 MG/ML IJ SOLN
INTRAMUSCULAR | Status: AC
  Filled 2018-03-26: qty 1

## 2018-03-26 MED ORDER — LACTATED RINGERS IV SOLN
INTRAVENOUS | Status: DC
  Administered 2018-03-26 (×2): via INTRAVENOUS

## 2018-03-26 MED ORDER — EPHEDRINE SULFATE 50 MG/ML IJ SOLN
INTRAMUSCULAR | Status: DC | PRN
  Administered 2018-03-26 (×3): 10 mg via INTRAVENOUS

## 2018-03-26 MED ORDER — SCOPOLAMINE 1 MG/3DAYS TD PT72
1.0000 | MEDICATED_PATCH | Freq: Once | TRANSDERMAL | Status: DC | PRN
  Administered 2018-03-26: 1.5 mg via TRANSDERMAL

## 2018-03-26 MED ORDER — GABAPENTIN 100 MG PO CAPS
ORAL_CAPSULE | ORAL | Status: AC
  Filled 2018-03-26: qty 1

## 2018-03-26 MED ORDER — MIDAZOLAM HCL 2 MG/2ML IJ SOLN: 0.5000 mg | Freq: Once | INTRAMUSCULAR | Status: DC | PRN

## 2018-03-26 MED ORDER — LIDOCAINE 2% (20 MG/ML) 5 ML SYRINGE
INTRAMUSCULAR | Status: AC
  Filled 2018-03-26: qty 5

## 2018-03-26 MED ORDER — ACETAMINOPHEN 500 MG PO TABS
1000.0000 mg | ORAL_TABLET | ORAL | Status: AC
  Administered 2018-03-26: 1000 mg via ORAL

## 2018-03-26 MED ORDER — ONDANSETRON HCL 4 MG/2ML IJ SOLN
INTRAMUSCULAR | Status: DC | PRN
  Administered 2018-03-26: 4 mg via INTRAVENOUS

## 2018-03-26 MED ORDER — FENTANYL CITRATE (PF) 100 MCG/2ML IJ SOLN
INTRAMUSCULAR | Status: AC
  Filled 2018-03-26: qty 2

## 2018-03-26 MED ORDER — KETOROLAC TROMETHAMINE 15 MG/ML IJ SOLN
15.0000 mg | INTRAMUSCULAR | Status: AC
  Administered 2018-03-26: 15 mg via INTRAVENOUS

## 2018-03-26 MED ORDER — FENTANYL CITRATE (PF) 100 MCG/2ML IJ SOLN
100.0000 ug | Freq: Once | INTRAMUSCULAR | Status: AC
  Administered 2018-03-26: 50 ug via INTRAVENOUS

## 2018-03-26 MED ORDER — FENTANYL CITRATE (PF) 100 MCG/2ML IJ SOLN
50.0000 ug | INTRAMUSCULAR | Status: DC | PRN
  Administered 2018-03-26: 100 ug via INTRAVENOUS
  Administered 2018-03-26: 50 ug via INTRAVENOUS

## 2018-03-26 MED ORDER — BUPIVACAINE HCL (PF) 0.25 % IJ SOLN
INTRAMUSCULAR | Status: DC | PRN
  Administered 2018-03-26: 10 mL

## 2018-03-26 MED ORDER — SCOPOLAMINE 1 MG/3DAYS TD PT72
MEDICATED_PATCH | TRANSDERMAL | Status: AC
  Filled 2018-03-26: qty 1

## 2018-03-26 MED ORDER — SUCCINYLCHOLINE CHLORIDE 20 MG/ML IJ SOLN
INTRAMUSCULAR | Status: DC | PRN
  Administered 2018-03-26: 100 mg via INTRAVENOUS

## 2018-03-26 MED ORDER — MIDAZOLAM HCL 2 MG/2ML IJ SOLN
INTRAMUSCULAR | Status: AC
  Filled 2018-03-26: qty 2

## 2018-03-26 MED ORDER — TRAMADOL HCL 50 MG PO TABS: 50.0000 mg | ORAL_TABLET | Freq: Four times a day (QID) | ORAL | 0 refills | Status: AC | PRN

## 2018-03-26 MED ORDER — CEFAZOLIN SODIUM-DEXTROSE 2-4 GM/100ML-% IV SOLN
INTRAVENOUS | Status: AC
  Filled 2018-03-26: qty 100

## 2018-03-26 MED ORDER — KETOROLAC TROMETHAMINE 15 MG/ML IJ SOLN
INTRAMUSCULAR | Status: AC
  Filled 2018-03-26: qty 1

## 2018-03-26 MED ORDER — ENSURE PRE-SURGERY PO LIQD: 296.0000 mL | Freq: Once | ORAL | Status: DC

## 2018-03-26 MED ORDER — LIDOCAINE HCL (CARDIAC) PF 100 MG/5ML IV SOSY
PREFILLED_SYRINGE | INTRAVENOUS | Status: DC | PRN
  Administered 2018-03-26: 20 mg via INTRAVENOUS

## 2018-03-26 MED ORDER — PROPOFOL 10 MG/ML IV BOLUS
INTRAVENOUS | Status: AC
  Filled 2018-03-26: qty 20

## 2018-03-26 MED ORDER — PROPOFOL 10 MG/ML IV BOLUS
INTRAVENOUS | Status: DC | PRN
  Administered 2018-03-26: 120 mg via INTRAVENOUS

## 2018-03-26 MED ORDER — DEXAMETHASONE SODIUM PHOSPHATE 4 MG/ML IJ SOLN
INTRAMUSCULAR | Status: DC | PRN
  Administered 2018-03-26: 10 mg via INTRAVENOUS

## 2018-03-26 SURGICAL SUPPLY — 64 items
ADH SKN CLS APL DERMABOND .7 (GAUZE/BANDAGES/DRESSINGS) ×1
APPLIER CLIP 9.375 MED OPEN (MISCELLANEOUS)
APR CLP MED 9.3 20 MLT OPN (MISCELLANEOUS)
BINDER BREAST LRG (GAUZE/BANDAGES/DRESSINGS) IMPLANT
BINDER BREAST MEDIUM (GAUZE/BANDAGES/DRESSINGS) IMPLANT
BINDER BREAST XLRG (GAUZE/BANDAGES/DRESSINGS) ×1 IMPLANT
BINDER BREAST XXLRG (GAUZE/BANDAGES/DRESSINGS) IMPLANT
BLADE SURG 15 STRL LF DISP TIS (BLADE) ×1 IMPLANT
BLADE SURG 15 STRL SS (BLADE) ×2
CANISTER SUCT 1200ML W/VALVE (MISCELLANEOUS) IMPLANT
CHLORAPREP W/TINT 26ML (MISCELLANEOUS) ×2 IMPLANT
CLIP APPLIE 9.375 MED OPEN (MISCELLANEOUS) IMPLANT
CLIP VESOCCLUDE SM WIDE 6/CT (CLIP) IMPLANT
COVER BACK TABLE 60X90IN (DRAPES) ×2 IMPLANT
COVER MAYO STAND STRL (DRAPES) ×2 IMPLANT
COVER WAND RF STERILE (DRAPES) IMPLANT
DECANTER SPIKE VIAL GLASS SM (MISCELLANEOUS) ×1 IMPLANT
DERMABOND ADVANCED (GAUZE/BANDAGES/DRESSINGS) ×1
DERMABOND ADVANCED .7 DNX12 (GAUZE/BANDAGES/DRESSINGS) ×1 IMPLANT
DEVICE DUBIN W/COMP PLATE 8390 (MISCELLANEOUS) IMPLANT
DRAPE LAPAROSCOPIC ABDOMINAL (DRAPES) ×2 IMPLANT
DRAPE UTILITY XL STRL (DRAPES) ×2 IMPLANT
DRSG TEGADERM 4X4.75 (GAUZE/BANDAGES/DRESSINGS) IMPLANT
ELECT COATED BLADE 2.86 ST (ELECTRODE) ×2 IMPLANT
ELECT REM PT RETURN 9FT ADLT (ELECTROSURGICAL) ×2
ELECTRODE REM PT RTRN 9FT ADLT (ELECTROSURGICAL) ×1 IMPLANT
GAUZE SPONGE 4X4 12PLY STRL LF (GAUZE/BANDAGES/DRESSINGS) ×2 IMPLANT
GLOVE BIO SURGEON STRL SZ7 (GLOVE) ×2 IMPLANT
GLOVE BIOGEL PI IND STRL 7.0 (GLOVE) IMPLANT
GLOVE BIOGEL PI IND STRL 7.5 (GLOVE) ×1 IMPLANT
GLOVE BIOGEL PI INDICATOR 7.0 (GLOVE) ×2
GLOVE BIOGEL PI INDICATOR 7.5 (GLOVE) ×1
GLOVE SURG SS PI 7.0 STRL IVOR (GLOVE) ×1 IMPLANT
GLOVE SURG SYN 7.5  E (GLOVE) ×3
GLOVE SURG SYN 7.5 E (GLOVE) ×3 IMPLANT
GLOVE SURG SYN 7.5 PF PI (GLOVE) IMPLANT
GOWN STRL REUS W/ TWL LRG LVL3 (GOWN DISPOSABLE) ×3 IMPLANT
GOWN STRL REUS W/TWL LRG LVL3 (GOWN DISPOSABLE) ×6
HEMOSTAT ARISTA ABSORB 3G PWDR (MISCELLANEOUS) IMPLANT
ILLUMINATOR WAVEGUIDE N/F (MISCELLANEOUS) IMPLANT
KIT MARKER MARGIN INK (KITS) ×2 IMPLANT
LIGHT WAVEGUIDE WIDE FLAT (MISCELLANEOUS) IMPLANT
NDL HYPO 25X1 1.5 SAFETY (NEEDLE) ×1 IMPLANT
NEEDLE HYPO 25X1 1.5 SAFETY (NEEDLE) ×2 IMPLANT
NS IRRIG 1000ML POUR BTL (IV SOLUTION) IMPLANT
PACK BASIN DAY SURGERY FS (CUSTOM PROCEDURE TRAY) ×2 IMPLANT
PENCIL BUTTON HOLSTER BLD 10FT (ELECTRODE) ×2 IMPLANT
SLEEVE SCD COMPRESS KNEE MED (MISCELLANEOUS) ×2 IMPLANT
SPONGE LAP 4X18 RFD (DISPOSABLE) ×2 IMPLANT
STRIP CLOSURE SKIN 1/2X4 (GAUZE/BANDAGES/DRESSINGS) ×2 IMPLANT
SUT CHROMIC 4 0 RB 1X27 (SUTURE) ×1 IMPLANT
SUT MNCRL AB 4-0 PS2 18 (SUTURE) IMPLANT
SUT MON AB 5-0 PS2 18 (SUTURE) ×1 IMPLANT
SUT SILK 2 0 SH (SUTURE) IMPLANT
SUT VIC AB 2-0 SH 27 (SUTURE) ×2
SUT VIC AB 2-0 SH 27XBRD (SUTURE) ×1 IMPLANT
SUT VIC AB 3-0 SH 27 (SUTURE) ×2
SUT VIC AB 3-0 SH 27X BRD (SUTURE) ×1 IMPLANT
SUT VIC AB 4-0 SH 18 (SUTURE) ×1 IMPLANT
SYR CONTROL 10ML LL (SYRINGE) ×2 IMPLANT
TOWEL GREEN STERILE FF (TOWEL DISPOSABLE) ×2 IMPLANT
TOWEL OR NON WOVEN STRL DISP B (DISPOSABLE) ×1 IMPLANT
TUBE CONNECTING 20X1/4 (TUBING) IMPLANT
YANKAUER SUCT BULB TIP NO VENT (SUCTIONS) IMPLANT

## 2018-03-26 NOTE — Anesthesia Preprocedure Evaluation (Addendum)
Anesthesia Evaluation  Patient identified by MRN, date of birth, ID band Patient awake    Reviewed: Allergy & Precautions, NPO status , Patient's Chart, lab work & pertinent test results  History of Anesthesia Complications Negative for: history of anesthetic complications  Airway Mallampati: II  TM Distance: >3 FB Neck ROM: Full    Dental  (+) Dental Advisory Given   Pulmonary neg pulmonary ROS,    breath sounds clear to auscultation       Cardiovascular hypertension (not requiring meds presently, but has in the past), (-) angina Rhythm:Regular Rate:Normal     Neuro/Psych negative neurological ROS     GI/Hepatic Neg liver ROS, GERD  Medicated and Poorly Controlled,  Endo/Other  negative endocrine ROS  Renal/GU negative Renal ROS     Musculoskeletal   Abdominal   Peds  Hematology negative hematology ROS (+)   Anesthesia Other Findings   Reproductive/Obstetrics                            Anesthesia Physical Anesthesia Plan  ASA: II  Anesthesia Plan: General   Post-op Pain Management:    Induction: Intravenous  PONV Risk Score and Plan: 3 and Scopolamine patch - Pre-op, Ondansetron and Dexamethasone  Airway Management Planned: Oral ETT  Additional Equipment:   Intra-op Plan:   Post-operative Plan: Extubation in OR  Informed Consent: I have reviewed the patients History and Physical, chart, labs and discussed the procedure including the risks, benefits and alternatives for the proposed anesthesia with the patient or authorized representative who has indicated his/her understanding and acceptance.   Dental advisory given  Plan Discussed with: CRNA and Surgeon  Anesthesia Plan Comments: (Plan routine monitors, GETA)        Anesthesia Quick Evaluation

## 2018-03-26 NOTE — Anesthesia Procedure Notes (Signed)
Procedure Name: Intubation Performed by: Verita Lamb, CRNA Pre-anesthesia Checklist: Timeout performed, Emergency Drugs available, Patient identified, Suction available and Patient being monitored Patient Re-evaluated:Patient Re-evaluated prior to induction Oxygen Delivery Method: Circle system utilized Preoxygenation: Pre-oxygenation with 100% oxygen Induction Type: IV induction, Rapid sequence and Cricoid Pressure applied Laryngoscope Size: Mac and 3 Grade View: Grade I Tube type: Oral Tube size: 7.0 mm Airway Equipment and Method: Stylet Placement Confirmation: ETT inserted through vocal cords under direct vision,  positive ETCO2,  CO2 detector and breath sounds checked- equal and bilateral Secured at: 22 cm Tube secured with: Tape Dental Injury: Teeth and Oropharynx as per pre-operative assessment

## 2018-03-26 NOTE — Anesthesia Postprocedure Evaluation (Signed)
Anesthesia Post Note  Patient: Otho BellowsKathy D Maultsby  Procedure(s) Performed: LEFT BREAST MASS EXCISIONAL BIOPSY (Left Breast)     Patient location during evaluation: PACU Anesthesia Type: General Level of consciousness: awake and alert, oriented and patient cooperative Pain management: pain level controlled Vital Signs Assessment: post-procedure vital signs reviewed and stable Respiratory status: spontaneous breathing, nonlabored ventilation and respiratory function stable Cardiovascular status: blood pressure returned to baseline and stable Postop Assessment: no apparent nausea or vomiting and adequate PO intake Anesthetic complications: no    Last Vitals:  Vitals:   03/26/18 1145 03/26/18 1220  BP: 121/61 127/68  Pulse: 76 77  Resp: 14 16  Temp:  36.7 C  SpO2: 96% 98%    Last Pain:  Vitals:   03/26/18 1220  TempSrc:   PainSc: 2                  Kanton Kamel,E. Treshaun Carrico

## 2018-03-26 NOTE — Transfer of Care (Signed)
Immediate Anesthesia Transfer of Care Note  Patient: Otho BellowsKathy D Keefe  Procedure(s) Performed: LEFT BREAST MASS EXCISIONAL BIOPSY (Left Breast)  Patient Location: PACU  Anesthesia Type:General  Level of Consciousness: awake, alert  and oriented  Airway & Oxygen Therapy: Patient Spontanous Breathing and Patient connected to face mask oxygen  Post-op Assessment: Report given to RN and Post -op Vital signs reviewed and stable  Post vital signs: Reviewed and stable  Last Vitals:  Vitals Value Taken Time  BP    Temp    Pulse    Resp    SpO2      Last Pain:  Vitals:   03/26/18 0856  TempSrc: Oral         Complications: No apparent anesthesia complications

## 2018-03-26 NOTE — Interval H&P Note (Signed)
History and Physical Interval Note:  03/26/2018 8:30 AM  Otho BellowsKathy D Belnap  has presented today for surgery, with the diagnosis of LEFT BREAST MASS  The various methods of treatment have been discussed with the patient and family. After consideration of risks, benefits and other options for treatment, the patient has consented to  Procedure(s): LEFT BREAST MASS EXCISIONAL BIOPSY (Left) as a surgical intervention .  The patient's history has been reviewed, patient examined, no change in status, stable for surgery.  I have reviewed the patient's chart and labs.  Questions were answered to the patient's satisfaction.     Emelia LoronMatthew Declan Mier

## 2018-03-26 NOTE — H&P (Signed)
  [id number] yof referred by Dr Cliffton AstersWhite for left breast mass. she has no real breast history. some remote fh of breast cancer. she noted in august right breast pain and inverted left nipple. no discharge really noted. on mm and us there is a 6mm lobulated mass concerning for malignancy. her density is b. she underwent us guided biopsy with pathology being duct wall with associated granulomatous inflammation and recent hemorrhage. she has no real recent infectious or traumatic history either. mri was obtained that shows an area of mass like enhancemnt in the retronipple left breast associated with retraction. the right side is normal and there is no adenopathy. she is here to discuss options   Past Surgical History Savannah Whitney(Savannah Engdahl, MD; 03/13/2018 12:11 PM) Cesarean Section - Multiple  Colon Polyp Removal - Colonoscopy  Foot Surgery  Right.  Allergies (Savannah Whitney, RMA; 03/13/2018 10:17 AM) Demerol *ANALGESICS - OPIOID*  Macrobid *URINARY ANTI-INFECTIVES*  Benadryl *ANTIHISTAMINES*  Allergies Reconciled   Medication History (Savannah Whitney, RMA; 03/13/2018 10:18 AM) Protonix (40MG  Tablet DR, Oral) Active. Tolterodine Tartrate (2MG  Tablet, Oral) Active. Pravastatin Sodium (40MG  Tablet, Oral) Active. ROPINIRole HCl (0.25MG  Tablet, Oral) Active. DULoxetine HCl (40MG  Capsule DR Part, Oral) Active. Meloxicam (15MG  Tablet, Oral) Active. Phentermine HCl (15MG  Capsule, Oral) Active. traMADol HCl (50MG  Tablet, Oral) Active. Benzonatate (100MG  Capsule, Oral) Active. Famotidine (20MG  Tablet, Oral) Active. Fluticasone Propionate (50MCG/ACT Suspension, Nasal) Active. Gabapentin (100MG  Capsule, Oral) Active. Losartan Potassium-HCTZ (50-12.5MG  Tablet, Oral) Active. Myrbetriq (50MG  Tablet ER 24HR, Oral) Active. Omeprazole (40MG  Capsule DR, Oral) Active. raNITIdine HCl (300MG  Tablet, Oral) Active. rOPINIRole HCl (1MG  Tablet, Oral) Active. Medications Reconciled  Social  History Savannah Whitney(Savannah Markwell, MD; 03/13/2018 12:11 PM) Caffeine use  Carbonated beverages, Coffee, Tea. No alcohol use  No drug use  Tobacco use  Never smoker.  Family History Savannah Whitney(Savannah Dinkins, MD; 03/13/2018 12:11 PM) Heart Disease  Family Members In General. Heart disease in female family member before age 59  Migraine Headache  Son. Prostate Cancer  Family Members In General, Father. Respiratory Condition  Mother.  Vitals (Savannah Whitney RMA; 03/13/2018 10:17 AM) 03/13/2018 10:17 AM Weight: 164.2 lb Height: 64in Body Surface Area: 1.8 m Body Mass Index: 28.18 kg/m  Temp.: 97.68F  Pulse: 84 (Regular)        Physical Exam Savannah Whitney([name] MD; 03/13/2018 12:07 PM) General Mental Status-Alert.  Head and Neck Trachea-midline. Thyroid Gland Characteristics - normal size and consistency.  Eye Sclera/Conjunctiva - Bilateral-No scleral icterus.  Chest and Lung Exam Chest and lung exam reveals -quiet, even and easy respiratory effort with no use of accessory muscles and on auscultation, normal breath sounds, no adventitious sounds and normal vocal resonance.  Breast Nipples-No Discharge.  Cardiovascular Cardiovascular examination reveals -normal heart sounds, regular rate and rhythm with no murmurs.  Neurologic Neurologic evaluation reveals -alert and oriented x 3 with no impairment of recent or remote memory.  Lymphatic Head & Neck  General Head & Neck Lymphatics: Bilateral - Description - Normal. Axillary  General Axillary Region: Bilateral - Description - Normal. Note: no  adenopathy     Assessment & Plan Savannah Whitney(Savannah Kasik MD; 03/13/2018 12:11 PM) LEFT BREAST MASS (N63.20) Story: I think unlikely this is cancer but cannot rule it out. discussed recommendation for excisional biopsy of the retronipple area of the left breast to rule out cancer and possibly give more information on what process is to treat. discussed  surgery, risks and recovery

## 2018-03-26 NOTE — Op Note (Signed)
Preoperative diagnosis: Inverted left nipple, abnormal retroareolar MRI Postoperative diagnosis: Same as above Procedure: Left breast excisional biopsy of retroareolar tissue Surgeon: Dr. Harden MoMatt Glena Pharris Anesthesia: General Estimated blood loss: Minimal Specimens: Left breast tissue and nipple marked with paint Complications: None Drains: None Sponge needle count was correct at completion Disposition to recovery in stable condition  Indications: This is a 57 year old female who had noted right breast pain and inverted left nipple.  She had a 6 mm lobulated mass that underwent biopsy and was granulomatous inflammation.  She has an MRI that shows a larger area of non-mass enhancement associated with retraction of the nipple.  She and I basically discussed and excision of all of the retroareolar tissue which should include the mass.  Procedure: After informed consent was obtained the patient was taken to the operating room.  She was given antibiotics.  SCDs were in place.  She was placed under general anesthesia without complication.  Her left breast was prepped and draped in the standard sterile surgical fashion.  Surgical timeout was then performed.  I can feel a mass below the nipple.  I then made a periareolar incision in order to hide the scar after infiltrating Marcaine.  I then proceeded to use cautery and the knife to remove all of the retroareolar tissue.  There were a lot of dilated ducts that were inspissated.  I passed this all off the table.  I she did remove the tissue right underneath the nipple as this was very hard and concerning with a 15 blade.  I made a small rent in the nipple and areolar junction.  I passed all of this off the table.  I then closed the nipple with 4-0 Vicryl and then some the 4-0 chromic suture.  The nipple was not inverted anymore after this.  I then closed the breast tissue with 2-0 Vicryl 3-0 Vicryl and 5-0 Monocryl.  Glue and Steri-Strips were applied.  She  tolerated this well was extubated and transferred to recovery stable.

## 2018-03-26 NOTE — Discharge Instructions (Signed)
Next dose of Tylenol and Ibuprofen at 3:40 PM.  [facility] Office Phone Number [phone] POST OP INSTRUCTIONS Take 400 mg of ibuprofen every 8 hours or 650 mg tylenol every 6 hours for next 72 hours then as needed. Use ice several times daily also. Always review your discharge instruction sheet given to you by the facility where your surgery was performed.  IF YOU HAVE DISABILITY OR FAMILY LEAVE FORMS, YOU MUST BRING THEM TO THE OFFICE FOR PROCESSING.  DO NOT GIVE THEM TO YOUR DOCTOR.  1. A prescription for pain medication may be given to you upon discharge.  Take your pain medication as prescribed, if needed.  If narcotic pain medicine is not needed, then you may take acetaminophen (Tylenol), naprosyn (Alleve) or ibuprofen (Advil) as needed. 2. Take your usually prescribed medications unless otherwise directed 3. If you need a refill on your pain medication, please contact your pharmacy.  They will contact our office to request authorization.  Prescriptions will not be filled after 5pm or on week-ends. 4. You should eat very light the first 24 hours after surgery, such as soup, crackers, pudding, etc.  Resume your normal diet the day after surgery. 5. Most patients will experience some swelling and bruising in the breast.  Ice packs and a good support bra will help.  Wear the breast binder provided or a sports bra for 72 hours day and night.  After that wear a sports bra during the day until you return to the office. Swelling and bruising can take several days to resolve.  6. It is common to experience some constipation if taking pain medication after surgery.  Increasing fluid intake and taking a stool softener will usually help or prevent this problem from occurring.  A mild laxative (Milk of Magnesia or Miralax) should be taken according to package directions if there are no bowel movements after 48 hours. 7. Unless discharge instructions indicate otherwise, you may remove  your bandages 48 hours after surgery and you may shower at that time.  You may have steri-strips (small skin tapes) in place directly over the incision.  These strips should be left on the skin for 7-10 days and will come off on their own.  If your surgeon used skin glue on the incision, you may shower in 24 hours.  The glue will flake off over the next 2-3 weeks.  Any sutures or staples will be removed at the office during your follow-up visit. 8. ACTIVITIES:  You may resume regular daily activities (gradually increasing) beginning the next day.  Wearing a good support bra or sports bra minimizes pain and swelling.  You may have sexual intercourse when it is comfortable. a. You may drive when you no longer are taking prescription pain medication, you can comfortably wear a seatbelt, and you can safely maneuver your car and apply brakes. b. RETURN TO WORK:  ______________________________________________________________________________________ 9. You should see your doctor in the office for a follow-up appointment approximately two weeks after your surgery.  Your doctors nurse will typically make your follow-up appointment when she calls you with your pathology report.  Expect your pathology report 3-4 business days after your surgery.  You may call to check if you do not hear from [place] after three days. 10. OTHER INSTRUCTIONS: _______________________________________________________________________________________________ _____________________________________________________________________________________________________________________________________ _____________________________________________________________________________________________________________________________________ _____________________________________________________________________________________________________________________________________  WHEN TO CALL DR WAKEFIELD: 1. Fever over 101.0 2. Nausea and/or vomiting. 3. Extreme  swelling or bruising. 4. Continued bleeding from incision. 5. Increased pain, redness, or drainage from  the incision.  The clinic staff is available to answer your questions during regular business hours.  Please dont hesitate to call and ask to speak to one of the nurses for clinical concerns.  If you have a medical emergency, go to the nearest emergency room or call 911.  [facility] always on call at the hospital.  For further questions, please visit [email or web address]    Post Anesthesia Home Care Instructions  Activity: Get plenty of rest for the remainder of the day. A responsible individual must stay with you for 24 hours following the procedure.  For the next 24 hours, DO NOT: -Drive a car -Advertising copywriterperate machinery -Drink alcoholic beverages -Take any medication unless instructed by your physician -Make any legal decisions or sign important papers.  Meals: Start with liquid foods such as gelatin or soup. Progress to regular foods as tolerated. Avoid greasy, spicy, heavy foods. If nausea and/or vomiting occur, drink only clear liquids until the nausea and/or vomiting subsides. Call your physician if vomiting continues.  Special Instructions/Symptoms: Your throat may feel dry or sore from the anesthesia or the breathing tube placed in your throat during surgery. If this causes discomfort, gargle with warm salt water. The discomfort should disappear within 24 hours.  If you had a scopolamine patch placed behind your ear for the management of post- operative nausea and/or vomiting:  1. The medication in the patch is effective for 72 hours, after which it should be removed.  Wrap patch in a tissue and discard in the trash. Wash hands thoroughly with soap and water. 2. You may remove the patch earlier than 72 hours if you experience unpleasant side effects which may include dry mouth, dizziness or visual disturbances. 3. Avoid touching the patch.  Wash your hands with soap and water after contact with the patch.

## 2018-03-27 ENCOUNTER — Encounter (HOSPITAL_BASED_OUTPATIENT_CLINIC_OR_DEPARTMENT_OTHER): Payer: Self-pay | Admitting: General Surgery

## 2018-04-24 ENCOUNTER — Other Ambulatory Visit: Payer: Self-pay | Admitting: Internal Medicine

## 2018-05-29 ENCOUNTER — Telehealth: Payer: Self-pay | Admitting: Internal Medicine

## 2018-05-29 NOTE — Telephone Encounter (Signed)
Called and spoke with the patient unfornatley the patient new insurance they do not cover our office so she would need a referral to a new pulmonologist. Doctor Sherene Sires could you please advise on the referral and a Doctor that is in the [facility]  that you would like her to go to?  Dr. Sherene Sires please advise.

## 2018-05-29 NOTE — Telephone Encounter (Signed)
Called and spoke with patient, she is aware of response and verbalized understanding. Nothing further needed.  

## 2018-05-29 NOTE — Telephone Encounter (Signed)
She does not need pulmonary at this point and I was rec ENT and best choice is at [facility] Dr Harriette Ohara( I realize she's seen [facility] but [facility] specializes in cough)   I don't know anyone at [facility] in pulmonary though

## 2018-12-03 ENCOUNTER — Other Ambulatory Visit: Payer: Self-pay | Admitting: Internal Medicine

## 2019-10-10 IMAGING — CR DG CHEST 2V
2 series · 2 of 2 positions shown · non-contrast
Comparison: None.

CLINICAL DATA: Chronic cough for several months

EXAM:
CHEST - 2 VIEW

[w chest pa]
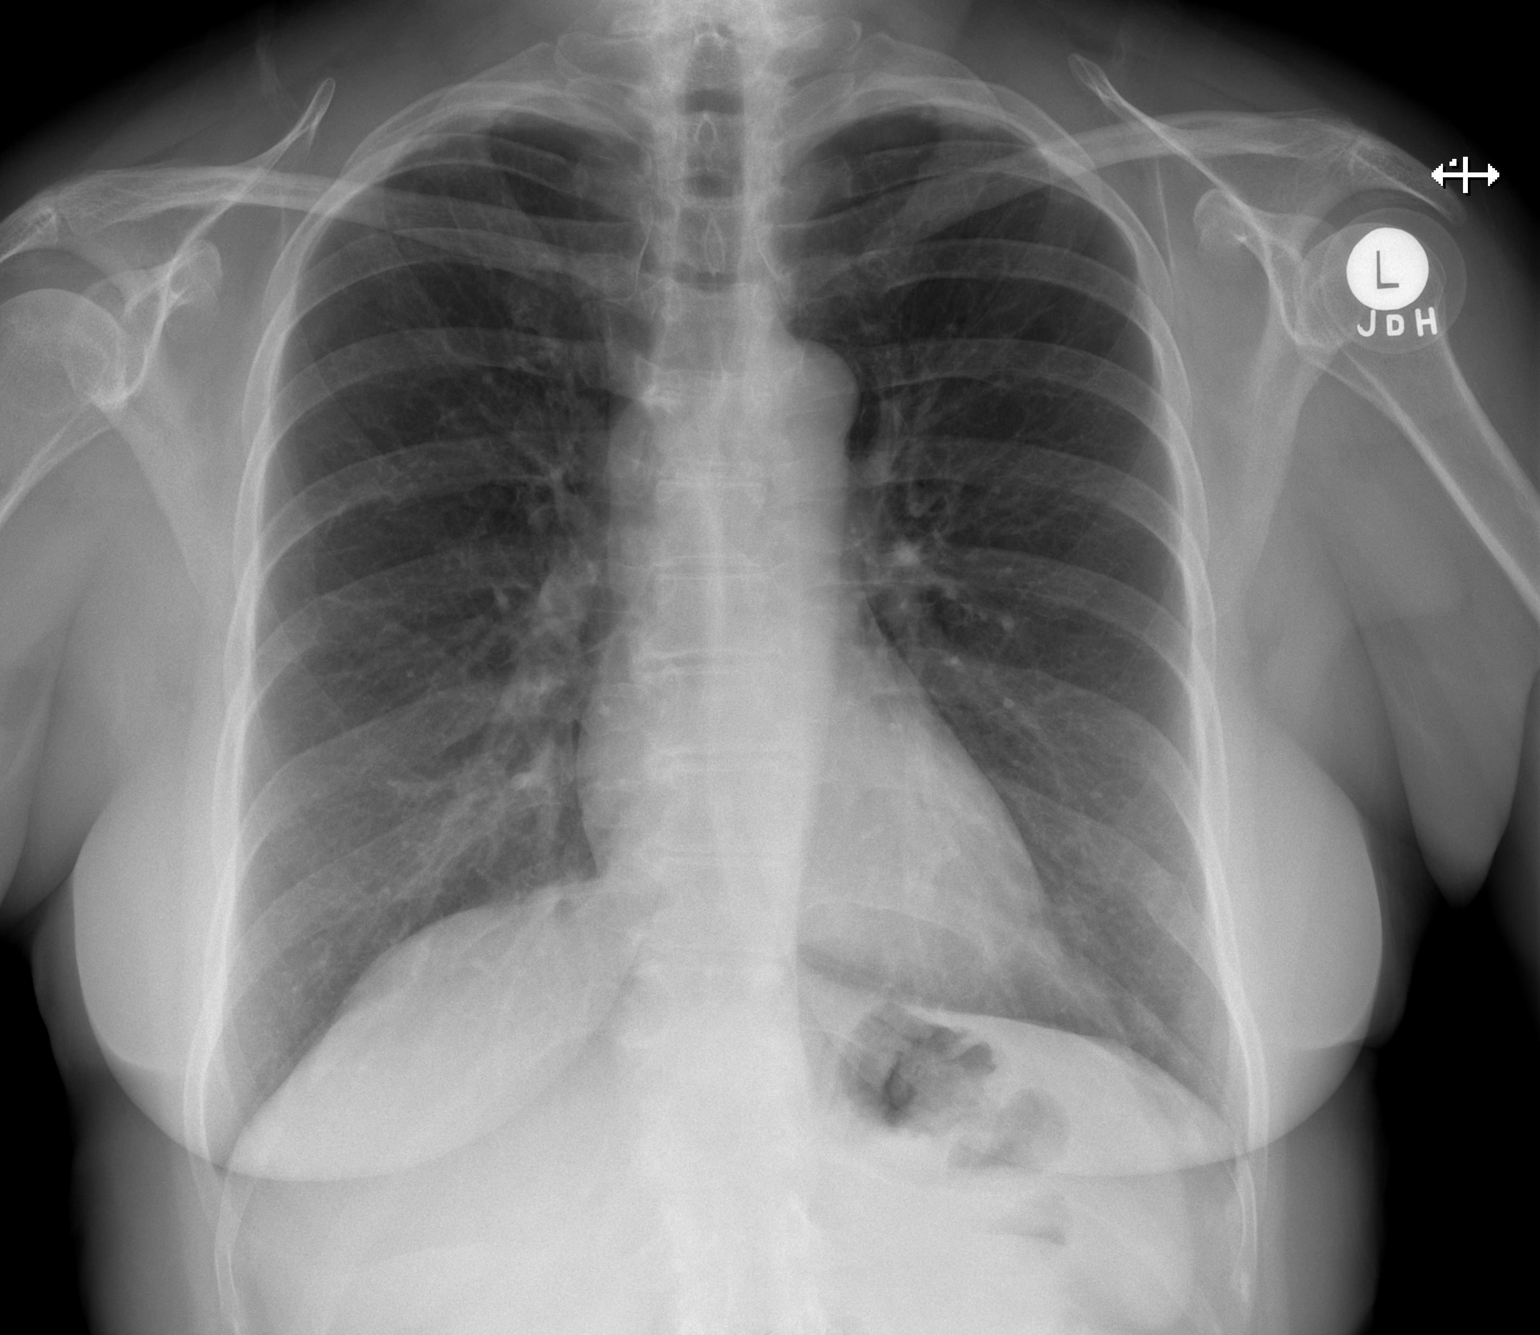

[w chest lat]
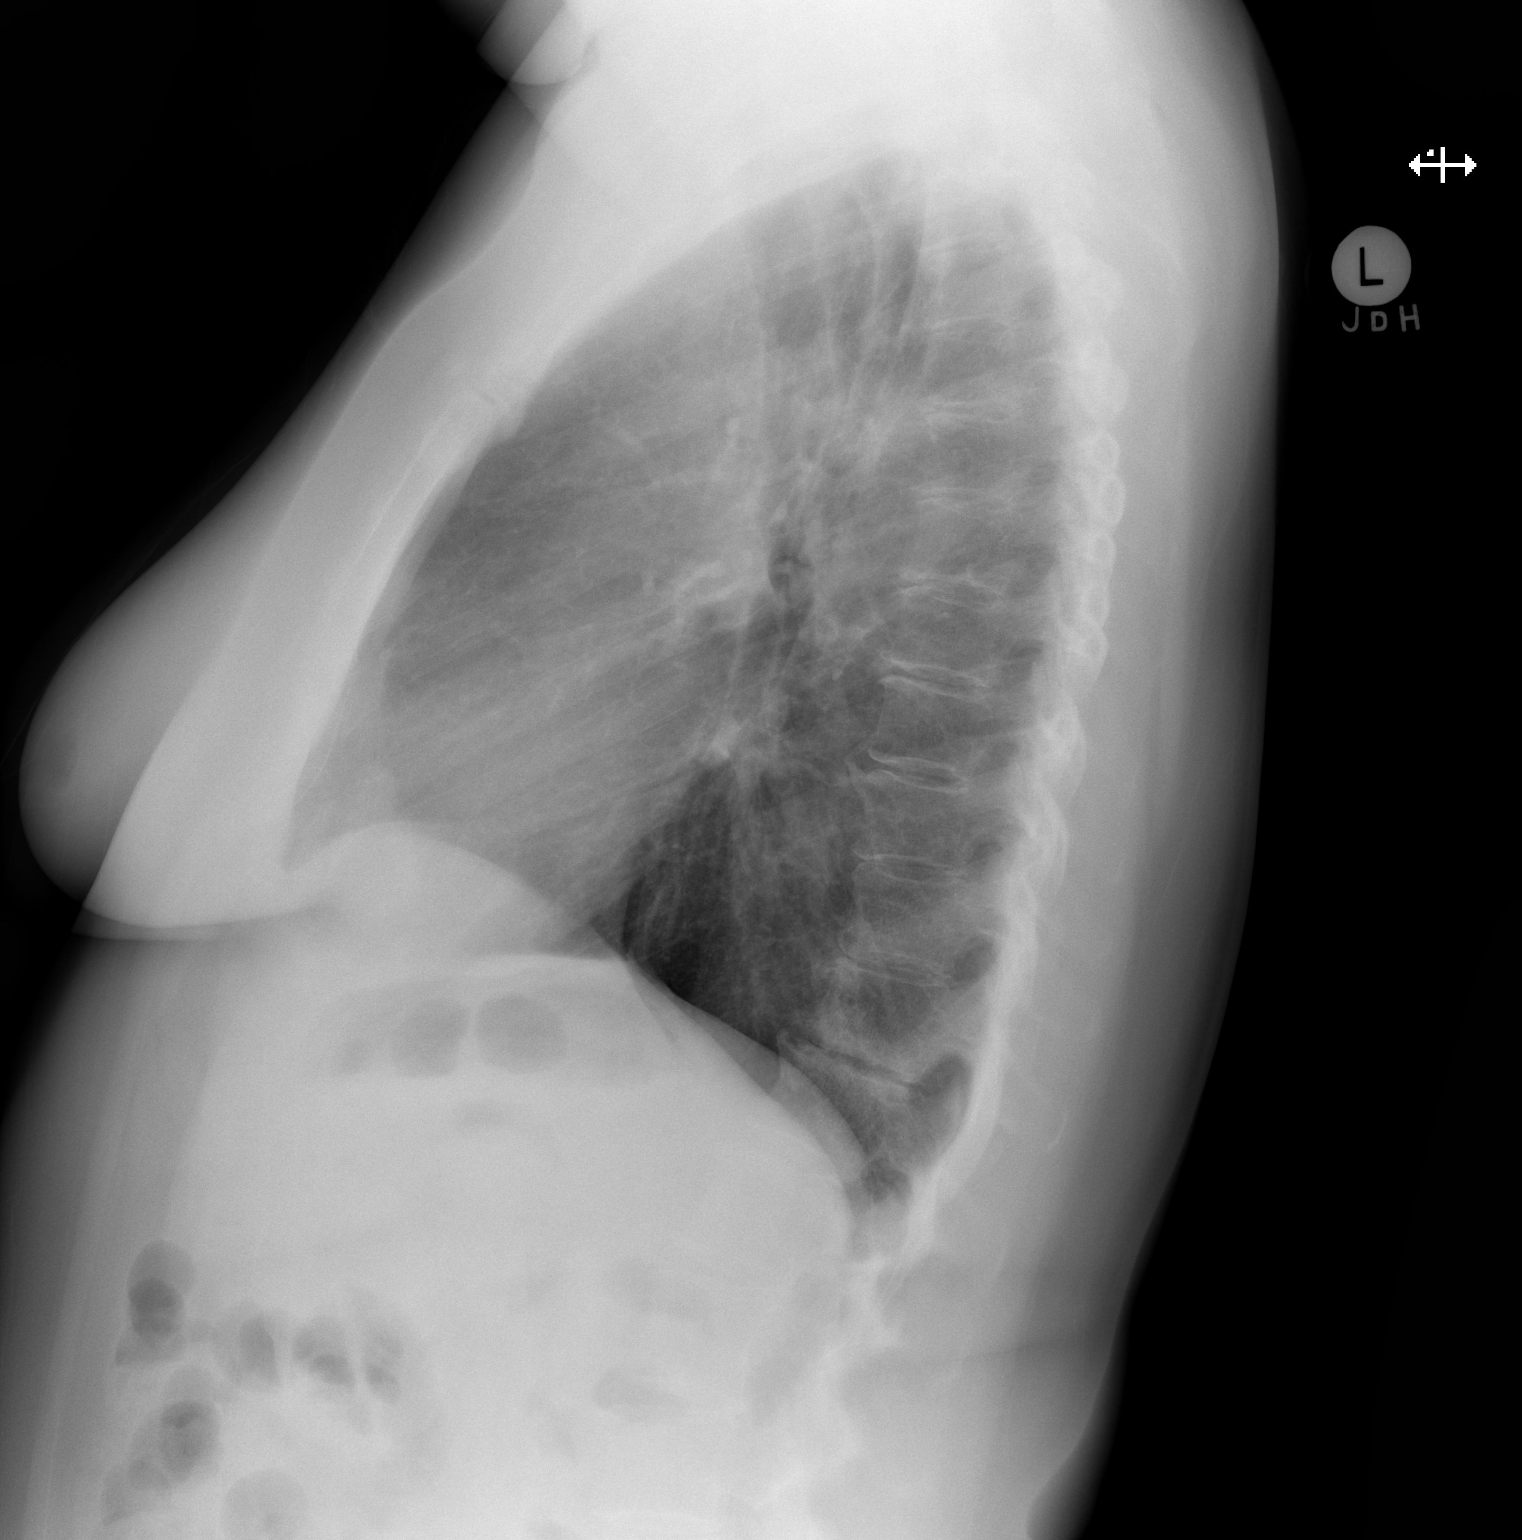

[2 of 2 positions shown; findings below may reference images not displayed]

FINDINGS: The heart size and mediastinal contours are within normal limits.
Both lungs are clear. The visualized skeletal structures are
unremarkable.
IMPRESSION: No active cardiopulmonary disease.

## 2019-12-06 IMAGING — MG DIGITAL DIAGNOSTIC UNILATERAL RIGHT MAMMOGRAM WITH TOMO AND CAD
4 series · 4 of 12 positions shown · non-contrast
Comparison: 05/21/2017 and earlier

CLINICAL DATA: Diffuse RIGHT breast pain which started earlier this
month.The patient has been evaluated by at [REDACTED] for
changes in the LEFT nipple. Ultrasound-guided core biopsy of the 8
o'clock retroareolar region shows duct wall with granulomatous
inflammation and hemorrhage. Given the discordant pathology results,
the patient has been referred to Dr. Filisberto, scheduled in early
[REDACTED].

EXAM:
DIGITAL DIAGNOSTIC UNILATERAL RIGHT MAMMOGRAM WITH CAD AND TOMO

[R CC synth-2D]
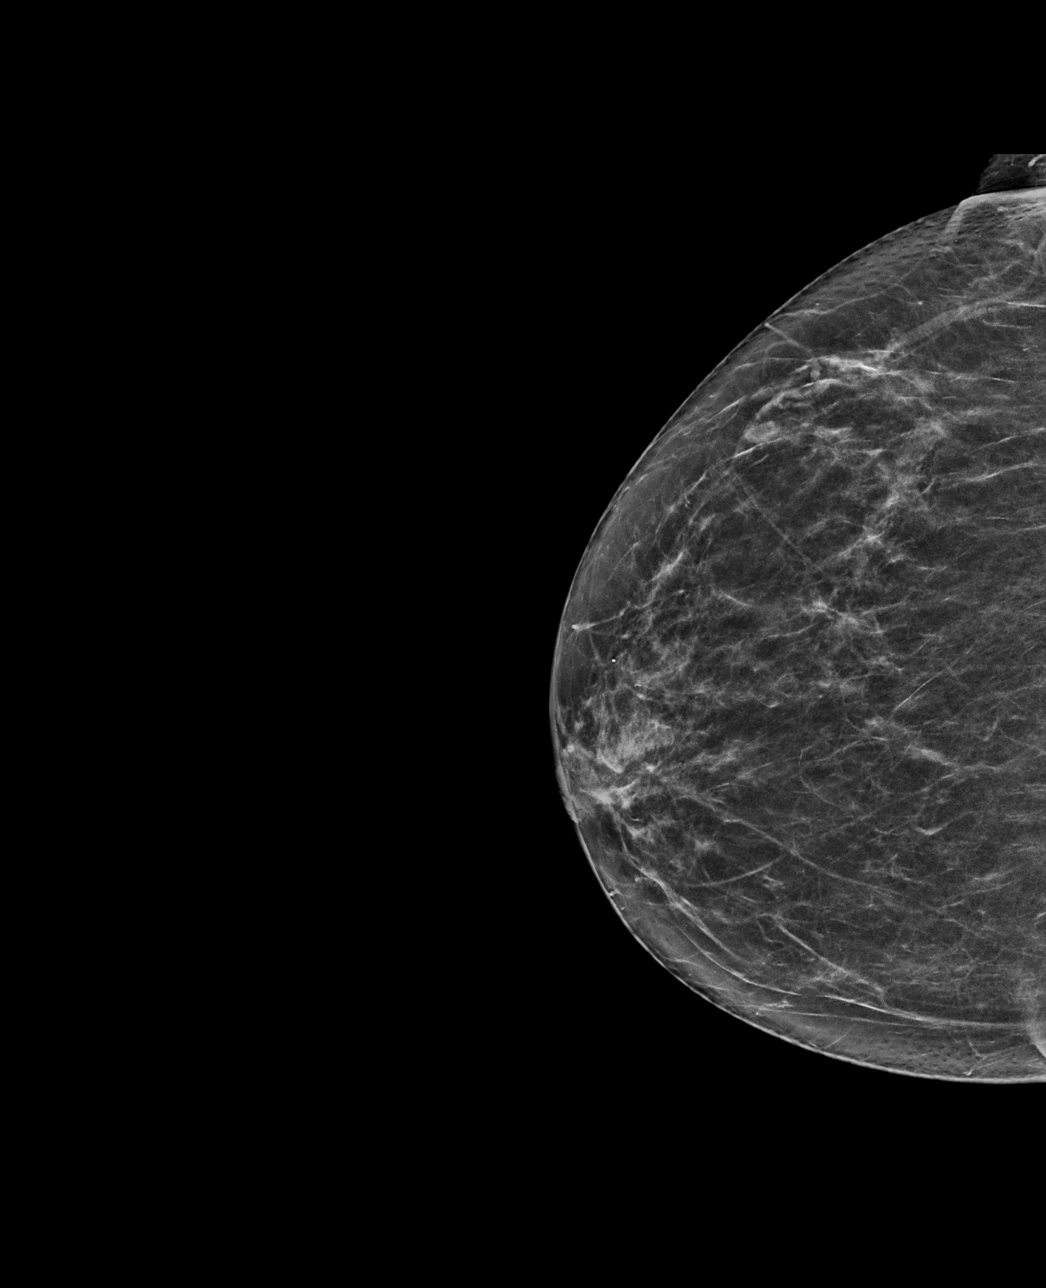

[R MLO synth-2D]
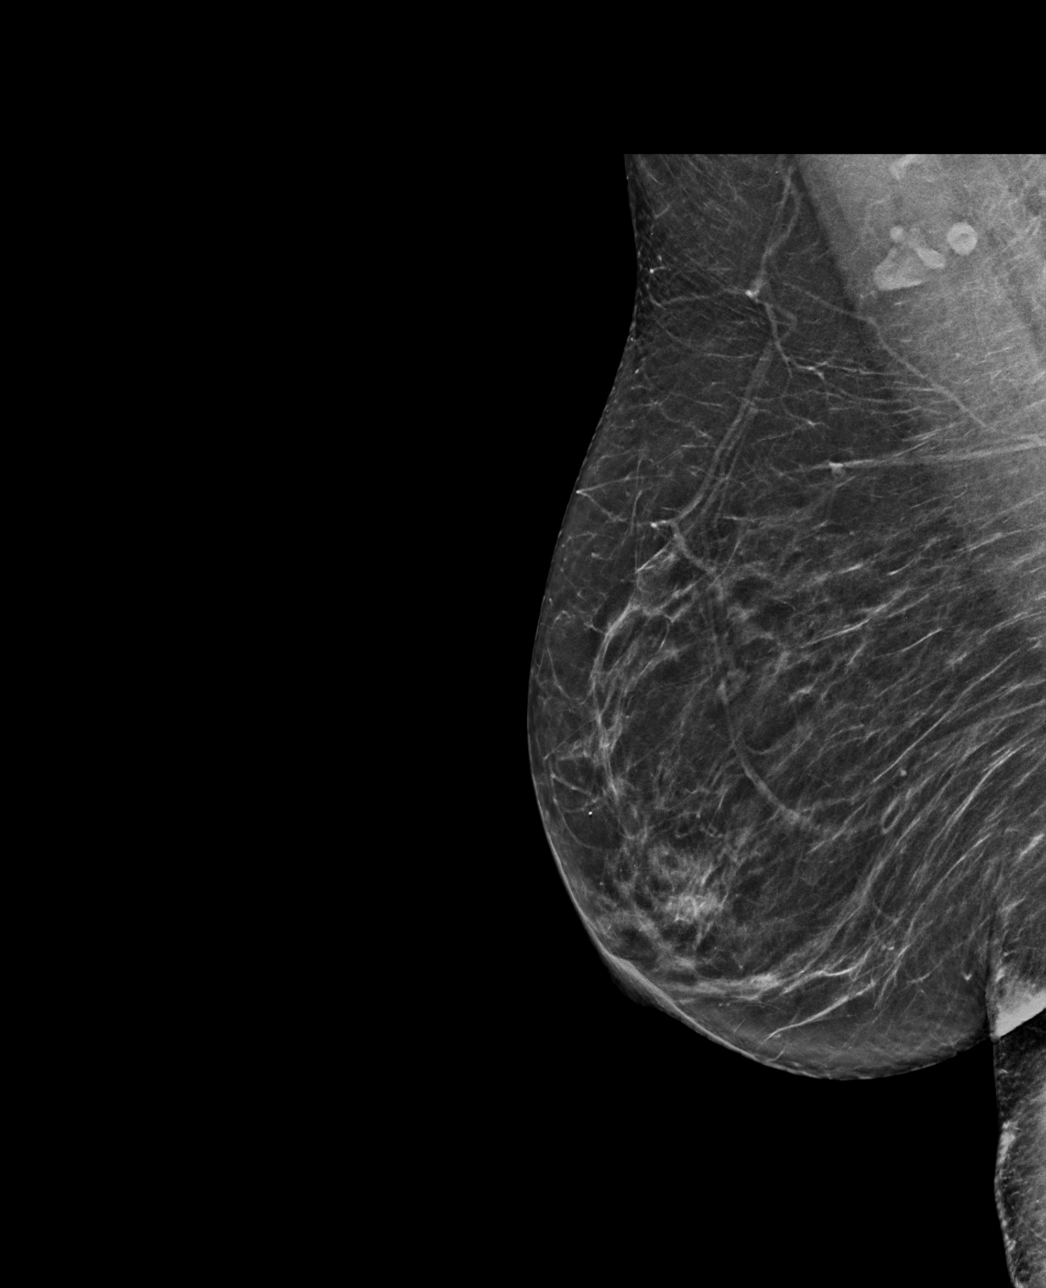

[R MLO tomo · tomo slice 35/68.0]
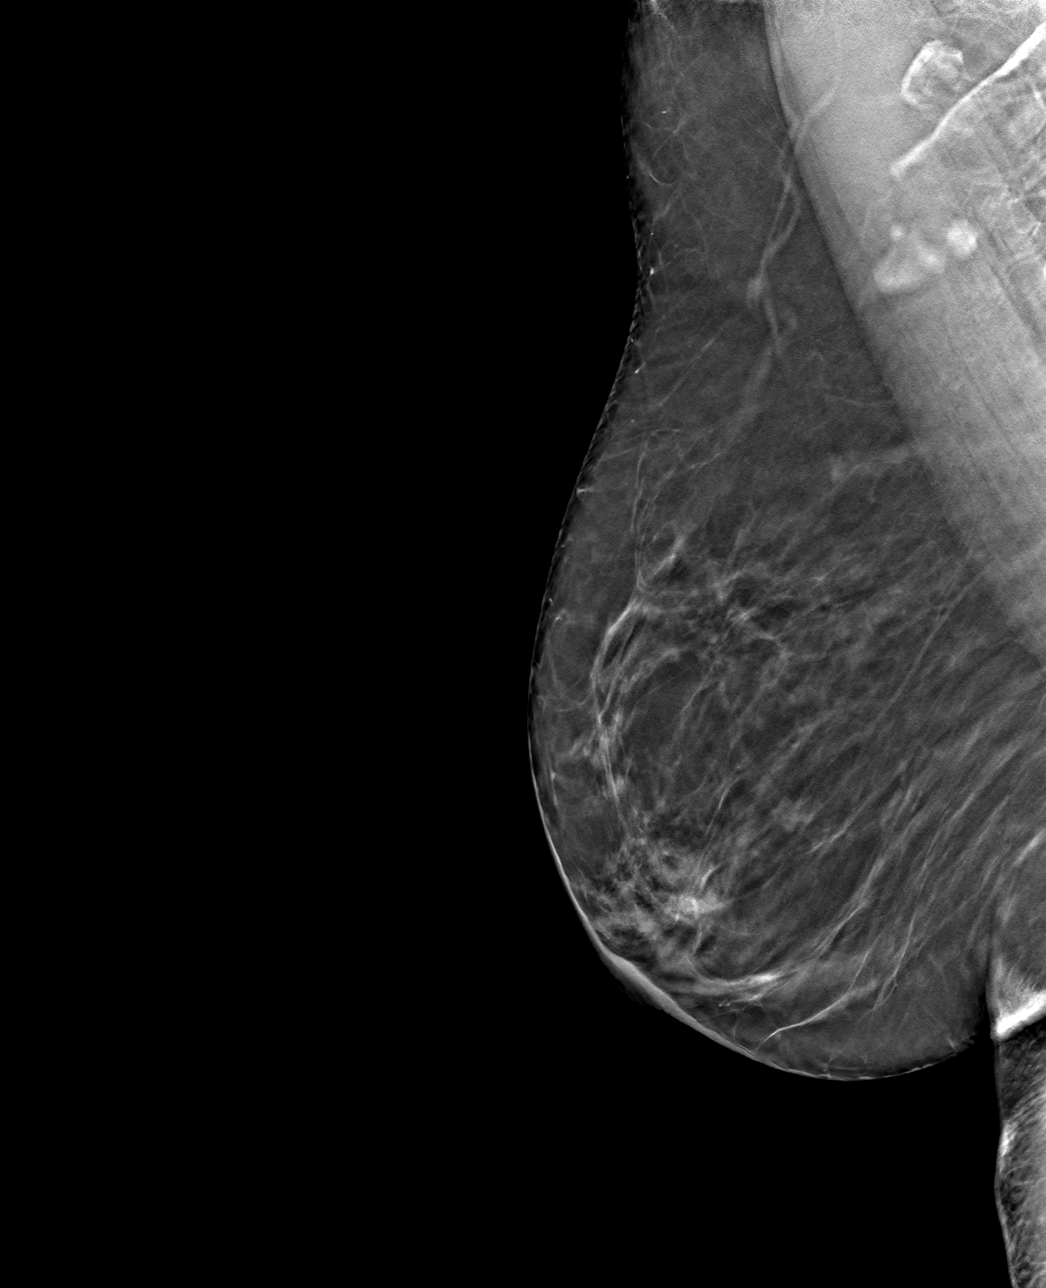

[R CC tomo · tomo slice 31/60.0]
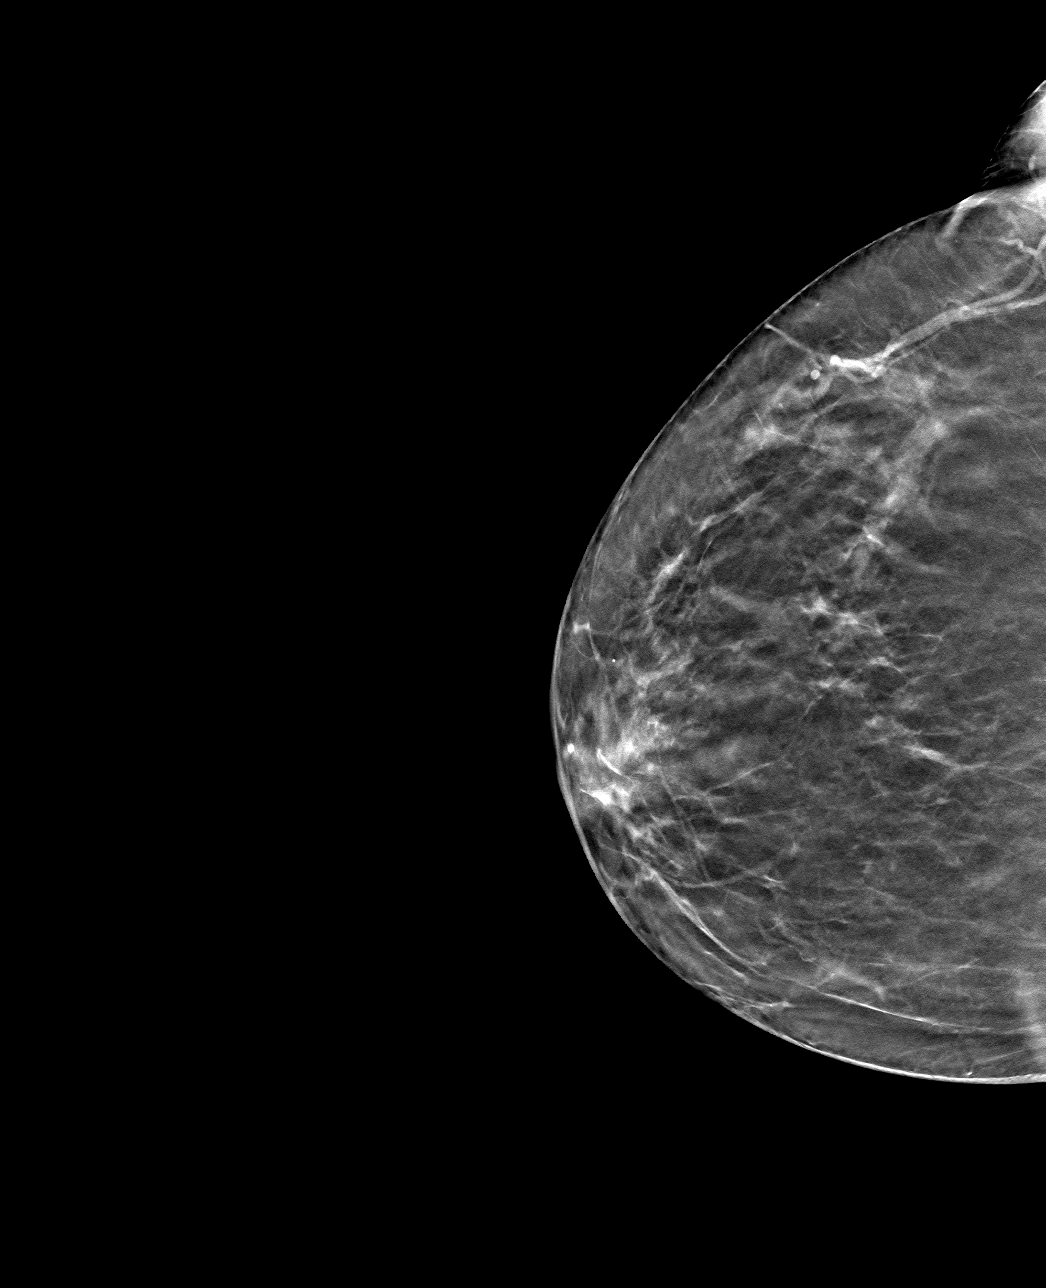

[4 of 12 positions shown; findings below may reference images not displayed]

ACR Breast Density Category b: There are scattered areas of
fibroglandular density.
FINDINGS: No suspicious mass, distortion, or microcalcifications are
identified to suggest presence of malignancy.

Mammographic images were processed with CAD.
IMPRESSION: No mammographic evidence for malignancy.

RECOMMENDATION:
1. Treatment plan for LEFT nipple changes in discordant pathology.
2. RIGHT mammogram is recommended in 1 year.

I have discussed the findings and recommendations with the patient.
Results were also provided in writing at the conclusion of the
visit. If applicable, a reminder letter will be sent to the patient
regarding the next appointment.

BI-RADS CATEGORY  1: Negative.

## 2020-11-06 ENCOUNTER — Other Ambulatory Visit: Payer: Self-pay

## 2020-11-06 ENCOUNTER — Encounter (HOSPITAL_COMMUNITY): Payer: Self-pay | Admitting: Emergency Medicine

## 2020-11-06 ENCOUNTER — Emergency Department (HOSPITAL_COMMUNITY)
Admission: EM | Admit: 2020-11-06 | Discharge: 2020-11-06 | Disposition: A | Discharge status: Home / Self Care | Payer: BLUE CROSS/BLUE SHIELD | Attending: Emergency Medicine | Admitting: Emergency Medicine

## 2020-11-06 DIAGNOSIS — W540XXA Bitten by dog, initial encounter: Secondary | ICD-10-CM | POA: Diagnosis not present

## 2020-11-06 DIAGNOSIS — Z91040 Latex allergy status: Secondary | ICD-10-CM | POA: Insufficient documentation

## 2020-11-06 DIAGNOSIS — I10 Essential (primary) hypertension: Secondary | ICD-10-CM | POA: Insufficient documentation

## 2020-11-06 DIAGNOSIS — Z23 Encounter for immunization: Secondary | ICD-10-CM | POA: Diagnosis not present

## 2020-11-06 DIAGNOSIS — S61411A Laceration without foreign body of right hand, initial encounter: Secondary | ICD-10-CM | POA: Diagnosis not present

## 2020-11-06 DIAGNOSIS — Z79899 Other long term (current) drug therapy: Secondary | ICD-10-CM | POA: Insufficient documentation

## 2020-11-06 DIAGNOSIS — S6991XA Unspecified injury of right wrist, hand and finger(s), initial encounter: Secondary | ICD-10-CM | POA: Diagnosis present

## 2020-11-06 MED ORDER — AMOXICILLIN-POT CLAVULANATE 875-125 MG PO TABS: 1.0000 | ORAL_TABLET | Freq: Two times a day (BID) | ORAL | 0 refills | Status: AC

## 2020-11-06 MED ORDER — TETANUS-DIPHTH-ACELL PERTUSSIS 5-2.5-18.5 LF-MCG/0.5 IM SUSY
0.5000 mL | PREFILLED_SYRINGE | Freq: Once | INTRAMUSCULAR | Status: AC
  Administered 2020-11-06: 0.5 mL via INTRAMUSCULAR
  Filled 2020-11-06: qty 0.5

## 2020-11-06 MED ORDER — AMOXICILLIN-POT CLAVULANATE 875-125 MG PO TABS
1.0000 | ORAL_TABLET | Freq: Once | ORAL | Status: AC
  Administered 2020-11-06: 1 via ORAL
  Filled 2020-11-06: qty 1

## 2020-11-06 NOTE — ED Provider Notes (Signed)
[facility] EMERGENCY DEPARTMENT Provider Note   CSN: [id number] Arrival date & time: 11/06/20  1113     History No chief complaint on file.   Savannah Whitney is a 60 y.o. female hx of GERD, HTN, here with dog bite.  Patient states that she was babysitting her son's puppy and he accidentally bit her on the hand.  She had a linear laceration of the right hand.  She states that the puppy is up-to-date with all its shots.  She states that she does not remember when was the last time she had a tetanus shot.  Patient states that she went to urgent care and they wanted her to be seen in the ED for further evaluation.  Denies any hand numbness.  The history is provided by the patient.      Past Medical History:  Diagnosis Date   Anxiety    Depression    GERD (gastroesophageal reflux disease)    High cholesterol    Pain     Patient Active Problem List   Diagnosis Date Noted   Eustachian tube dysfunction, right 02/04/2018   Allergic rhinitis 02/04/2018   Excessive ear wax, left 01/09/2018   Upper airway cough syndrome 01/08/2018    Past Surgical History:  Procedure Laterality Date   BREAST BIOPSY Left 03/26/2018   Procedure: LEFT BREAST MASS EXCISIONAL BIOPSY;  Surgeon: Emelia Loron, MD;  [facility];  Service: General;  Laterality: Left;   BUNIONETTE EXCISION  2012   CESAREAN SECTION  1982 and 1984    X 2    FEMORAL HERNIA REPAIR Right 2008     OB History   No obstetric history on file.     Family History  Problem Relation Age of Onset   Hyperlipidemia Mother    COPD Mother    Cancer Father     Social History   Tobacco Use   Smoking status: Never   Smokeless tobacco: Never  Substance Use Topics   Alcohol use: No   Drug use: No    Home Medications Prior to Admission medications   Medication Sig Start Date End Date Taking? Authorizing Provider  benzonatate (TESSALON) 100 MG capsule Take 100 mg by mouth 3 (three)  times daily as needed for cough.    [provider]  Cholecalciferol (VITAMIN D3) 10000 units capsule Take 10,000 Units by mouth daily.    [provider]  famotidine (PEPCID) 20 MG tablet Take 1 tablet (20 mg total) by mouth at bedtime. 02/05/18   Coral Ceo, NP  fexofenadine (ALLEGRA) 180 MG tablet Take 180 mg by mouth daily as needed for allergies or rhinitis.    [provider]  fluticasone (FLONASE) 50 MCG/ACT nasal spray Place 2 sprays into both nostrils 2 (two) times daily. 02/04/18   Coral Ceo, NP  gabapentin (NEURONTIN) 100 MG capsule TAKE 1 CAPSULE BY MOUTH 4 TIMES DAILY 04/24/18   Nyoka Cowden, MD  meloxicam (MOBIC) 15 MG tablet Take 15 mg by mouth daily.    [provider]  Multiple Minerals-Vitamins (CALCIUM-MAGNESIUM-ZINC-D3) TABS Take by mouth 2 (two) times daily.    [provider]  Multiple Vitamin (MULTIVITAMIN) capsule Take 1 capsule by mouth daily.    [provider]  MYRBETRIQ 50 MG TB24 tablet Take 50 mg by mouth daily. 11/29/17   [provider]  omeprazole (PRILOSEC) 40 MG capsule Take 40 mg by mouth 2 (two) times daily.  [provider]  polyethylene glycol (MIRALAX / GLYCOLAX) packet Take 17 g by mouth daily.    [provider]  pravastatin (PRAVACHOL) 40 MG tablet Take 40 mg by mouth every 7 (seven) days.     [provider]  rOPINIRole (REQUIP) 1 MG tablet Take 1 mg by mouth at bedtime.    [provider]  traMADol (ULTRAM) 50 MG tablet Take by mouth every 6 (six) hours as needed.    [provider]  traMADol (ULTRAM) 50 MG tablet Take 1 tablet (50 mg total) by mouth every 6 (six) hours as needed. 03/26/18   Emelia Loron, MD    Allergies    Lamisil Olivia Mackie hcl], Macrobid [nitrofurantoin monohyd macro], Hydrocodone, Demerol [meperidine], Latex, Lexapro [escitalopram oxalate], Promethazine, and Trospium  Review of Systems   Review of Systems   Skin:  Positive for wound.  All other systems reviewed and are negative.  Physical Exam Updated Vital Signs BP (!) 151/85 (BP Location: Left Arm)   Pulse 89   Temp (!) 97.5 F (36.4 C) (Oral)   Resp 16   SpO2 92%   Physical Exam Vitals and nursing note reviewed.  Constitutional:      Appearance: Normal appearance.  HENT:     Head: Normocephalic.     Nose: Nose normal.     Mouth/Throat:     Mouth: Mucous membranes are moist.  Eyes:     Extraocular Movements: Extraocular movements intact.     Pupils: Pupils are equal, round, and reactive to light.  Cardiovascular:     Rate and Rhythm: Normal rate and regular rhythm.     Pulses: Normal pulses.     Heart sounds: Normal heart sounds.  Pulmonary:     Effort: Pulmonary effort is normal.     Breath sounds: Normal breath sounds.  Abdominal:     General: Abdomen is flat.     Palpations: Abdomen is soft.  Musculoskeletal:     Cervical back: Normal range of motion and neck supple.     Comments: 3 cm laceration on the dorsal aspect of the right hand. Nl capillary refill in all fingers, able to flex and extend fingers. 2 + radial pulse   Skin:    General: Skin is warm.     Capillary Refill: Capillary refill takes less than 2 seconds.  Neurological:     General: No focal deficit present.     Mental Status: She is alert and oriented to person, place, and time.  Psychiatric:        Mood and Affect: Mood normal.        Behavior: Behavior normal.    ED Results / Procedures / Treatments   Labs (all labs ordered are listed, but only abnormal results are displayed) Labs Reviewed - No data to display  EKG None  Radiology No results found.  Procedures Procedures   LACERATION REPAIR Performed by: Richardean Canal Authorized by: Richardean Canal Consent: Verbal consent obtained. Risks and benefits: risks, benefits and alternatives were discussed Consent given by: patient Patient identity confirmed: provided demographic data Prepped  and Draped in normal sterile fashion Wound explored  Laceration Location: R hand   Laceration Length: 3 cm  No Foreign Bodies seen or palpated  Anesthesia: local infiltration  Local anesthetic: none   Irrigation method: syringe Amount of cleaning: standard  Skin closure: steri strips   Patient tolerance: Patient tolerated the procedure well with no immediate complications.   Medications Ordered in ED  Medications  Tdap (BOOSTRIX) injection 0.5 mL (has no administration in time range)  amoxicillin-clavulanate (AUGMENTIN) 875-125 MG per tablet 1 tablet (has no administration in time range)    ED Course  I have reviewed the triage vital signs and the nursing notes.  Pertinent labs & imaging results that were available during my care of the patient were reviewed by me and considered in my medical decision making (see chart for details).    MDM Rules/Calculators/A&P                          FREDERICKA BOTTCHER is a 60 y.o. female here presenting with laceration to the dorsum of the right hand from dog bite.  It appears to be linear laceration that is well approximated.  I discussed options with her.  I was able to irrigate the wound extensively.  There is no tendons involved.  I told her that it is probably best to just Steri-Strip and give antibiotics and update tetanus.  She is in agreement with that. Told her to come back in 2 days for wound check if wound shows any signs of infection    Final Clinical Impression(s) / ED Diagnoses Final diagnoses:  None    Rx / DC Orders ED Discharge Orders     None        Savannah Pander, MD 11/06/20 1551

## 2020-11-06 NOTE — Discharge Instructions (Addendum)
Since you have a dog bite, I used Steri-Strips to loosely approximate the wound.  Please keep the area clean and dry.   Take Augmentin twice daily for a week  If you see any signs of wound infection including redness and purulent discharge, please return in 48 hours for wound check  The Steri-Strip will fall off by itself  See your doctor for follow-up  Return to ER if you have fever or severe pain or purulent drainage

## 2020-11-06 NOTE — ED Notes (Signed)
Patient discharge instructions reviewed with the patient. The patient verbalized understanding of instructions. Patient discharged. 

## 2020-11-06 NOTE — ED Triage Notes (Signed)
Pt reports laceration to R posterior hand from her son's dog's nail this morning.  Unknown DT.   Sent from [facility].  Coban dressing in place.  CMS intact.

## 2021-04-11 ENCOUNTER — Telehealth: Payer: Self-pay | Admitting: Family Medicine

## 2021-04-11 NOTE — Telephone Encounter (Signed)
This is not a patient in our office. I am confused why this was sent to me.

## 2023-05-14 ENCOUNTER — Encounter (HOSPITAL_BASED_OUTPATIENT_CLINIC_OR_DEPARTMENT_OTHER): Payer: Self-pay | Admitting: Emergency Medicine

## 2023-05-14 ENCOUNTER — Emergency Department (HOSPITAL_BASED_OUTPATIENT_CLINIC_OR_DEPARTMENT_OTHER)
Admission: EM | Admit: 2023-05-14 | Discharge: 2023-05-15 | Disposition: A | Discharge status: Home / Self Care | Payer: BLUE CROSS/BLUE SHIELD | Attending: Emergency Medicine | Admitting: Emergency Medicine

## 2023-05-14 DIAGNOSIS — F410 Panic disorder [episodic paroxysmal anxiety] without agoraphobia: Secondary | ICD-10-CM | POA: Diagnosis present

## 2023-05-14 DIAGNOSIS — F419 Anxiety disorder, unspecified: Secondary | ICD-10-CM | POA: Insufficient documentation

## 2023-05-14 DIAGNOSIS — F32A Depression, unspecified: Secondary | ICD-10-CM | POA: Insufficient documentation

## 2023-05-14 MED ORDER — LORAZEPAM 1 MG PO TABS: 1.0000 mg | ORAL_TABLET | Freq: Three times a day (TID) | ORAL | 0 refills | Status: AC | PRN

## 2023-05-14 MED ORDER — LORAZEPAM 1 MG PO TABS
1.0000 mg | ORAL_TABLET | Freq: Once | ORAL | Status: AC
Start: 2023-05-14 — End: 2023-05-14
  Administered 2023-05-14: 1 mg via ORAL
  Filled 2023-05-14: qty 1

## 2023-05-14 NOTE — ED Provider Notes (Signed)
 [facility] Emergency Department Provider Note MRN:  [id number]  Arrival date & time: 05/14/23     Chief Complaint   Panic Attack   History of Present Illness   Savannah Whitney is a 63 y.o. year-old female with a history of anxiety, depression presenting to the ED with chief complaint of panic attack.  Patient having overwhelming stress and anxiety stemming from family issues.  Her mother has dementia and got out of the house and fell and she feels guilty about that.  Her son has bad headaches and was banging his head against the wall the other day and she feels bad about that.  Her granddaughter witnessed this and she feels bad about that.  She seems to be at the end of her rope.  She is now concerned that her husband is getting tired of her stress anxiety and depression and will soon leave her.  Denies SI or HI or AVH, sometimes wishes that she would not wake up but denies wanting to be dead.  Review of Systems  A thorough review of systems was obtained and all systems are negative except as noted in the HPI and PMH.   Patient's Health History    Past Medical History:  Diagnosis Date   Anxiety    Depression    GERD (gastroesophageal reflux disease)    High cholesterol    Pain     Past Surgical History:  Procedure Laterality Date   BLADDER SURGERY     BREAST BIOPSY Left 03/26/2018   Procedure: LEFT BREAST MASS EXCISIONAL BIOPSY;  Surgeon: Ebbie Cough, MD;  Location: [facility];  Service: General;  Laterality: Left;   BUNIONETTE EXCISION  2012   CESAREAN SECTION  1982 and 1984    X 2    FEMORAL HERNIA REPAIR Right 2008    Family History  Problem Relation Age of Onset   Hyperlipidemia Mother    COPD Mother    Cancer Father     Social History   Socioeconomic History   Marital status: Married    Spouse name: Not on file   Number of children: Not on file   Years of education: Not on file   Highest education level: Not on  file  Occupational History   Not on file  Tobacco Use   Smoking status: Never   Smokeless tobacco: Never  Substance and Sexual Activity   Alcohol use: No   Drug use: No   Sexual activity: Not on file  Other Topics Concern   Not on file  Social History Narrative   Not on file   Social Drivers of Health   Financial Resource Strain: Not on file  Food Insecurity: Low Risk  (02/22/2023)   Received from [facility]   Hunger Vital Sign    Worried About Running Out of Food in the Last Year: Never true    Ran Out of Food in the Last Year: Never true  Transportation Needs: No Transportation Needs (02/22/2023)   Received from [facility]    In the past 12 months, has lack of reliable transportation kept you from medical appointments, meetings, work or from getting things needed for daily living? : No  Physical Activity: Not on file  Stress: Not on file  Social Connections: Not on file  Intimate Partner Violence: Not on file     Physical Exam   Vitals:   05/14/23 2300 05/14/23 2345  BP: 136/78 128/84  Pulse:  82 81  Resp:  10  Temp:    SpO2: 99% 97%    CONSTITUTIONAL: Well-appearing, anxious and tearful NEURO/PSYCH:  Alert and oriented x 3, no focal deficits EYES:  eyes equal and reactive ENT/NECK:  no LAD, no JVD CARDIO: Regular rate, well-perfused, normal S1 and S2 PULM:  CTAB no wheezing or rhonchi GI/GU:  non-distended, non-tender MSK/SPINE:  No gross deformities, no edema SKIN:  no rash, atraumatic   *Additional and/or pertinent findings included in MDM below  Diagnostic and Interventional Summary    EKG Interpretation Date/Time:  Monday May 14 2023 23:34:51 EST Ventricular Rate:  87 PR Interval:  145 QRS Duration:  68 QT Interval:  379 QTC Calculation: 456 R Axis:   50  Text Interpretation: Sinus rhythm Confirmed by Theadore Sharper [id number]) on 05/14/2023 11:53:32 PM       Labs Reviewed - No data to display  No orders to display     Medications  LORazepam  (ATIVAN ) tablet 1 mg (1 mg Oral Given 05/14/23 2336)     Procedures  /  Critical Care Procedures  ED Course and Medical Decision Making  Initial Impression and Ddx Patient having significant stress and anxiety and may also be experiencing major depressive disorder.  Feeling very guilty about everything, trouble sleeping excetra.  Has had a few medication changes that do not seem to be helping.  However I do not feel she is a danger to self or others, adamantly denies SI or HI or AVH, she has a follow-up appointment with a counselor tomorrow morning.  We discussed the pros and cons of mental health evaluation with TTS this evening.  In the end, she and her husband feel comfortable with discharge home and keeping the follow-up tomorrow.  Providing dose of Ativan  and very short prescription to help her calm down to make it to this appointment.  Past medical/surgical history that increases complexity of ED encounter: Anxiety and depression  Interpretation of Diagnostics I personally reviewed the EKG and my interpretation is as follows: Sinus rhythm without concerning features    Patient Reassessment and Ultimate Disposition/Management     Discharge  Patient management required discussion with the following services or consulting groups:  None  Complexity of Problems Addressed Acute illness or injury that poses threat of life of bodily function  Additional Data Reviewed and Analyzed Further history obtained from: Further history from spouse/family member  Additional Factors Impacting ED Encounter Risk Prescriptions  Sharper HERO. Theadore, MD [facility] Emergency Medicine [facility] [email or web address]  Final Clinical Impressions(s) / ED Diagnoses     ICD-10-CM   1. Anxiety  F41.9     2. Depression, unspecified depression type  F32.A       ED Discharge Orders          Ordered    LORazepam  (ATIVAN ) 1 MG tablet  3 times daily PRN         05/14/23 2350             Discharge Instructions Discussed with and Provided to Patient:    Discharge Instructions      You were evaluated in the Emergency Department and after careful evaluation, we did not find any emergent condition requiring admission or further testing in the hospital.  Your exam/testing today is overall reassuring.  Important that you keep your follow-up with your counselor tomorrow to discuss your symptoms and your medical management.  Can use the Ativan  for significant stress, please use sparingly.  This  is not a good medicine to be on long-term as we discussed.  Please return to the Emergency Department if you experience any worsening of your condition.   Thank you for allowing us  to be a part of your care.      Theadore Ozell HERO, MD 05/14/23 [phone]

## 2023-05-14 NOTE — ED Notes (Signed)
 Initial contact made. Pt is resting in bed. Pt states she has been having difficulty with things at home. 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 Pt recently had a change to medications with her trazadone being reduced to 25 mg as well as being placed on Zoloft

## 2023-05-14 NOTE — Discharge Instructions (Signed)
 You were evaluated in the Emergency Department and after careful evaluation, we did not find any emergent condition requiring admission or further testing in the hospital.  Your exam/testing today is overall reassuring.  Important that you keep your follow-up with your counselor tomorrow to discuss your symptoms and your medical management.  Can use the Ativan  for significant stress, please use sparingly.  This is not a good medicine to be on long-term as we discussed.  Please return to the Emergency Department if you experience any worsening of your condition.   Thank you for allowing us  to be a part of your care.

## 2023-05-14 NOTE — ED Triage Notes (Signed)
 Since christmas Anxiety and panic attacks Seen by PCP med changes Was on trazodone Now changed to zoloft    Now feels like she can not function and Is having frequent panic attacks.
